# Patient Record
Sex: Male | Born: 1970 | Race: White | Hispanic: No | Marital: Married | Smoking: Current every day smoker
Health system: Southern US, Community
[De-identification: ages and names within clinical notes are randomized; demographics above are authoritative.]

## PROBLEM LIST (undated history)

## (undated) DIAGNOSIS — N289 Disorder of kidney and ureter, unspecified: Secondary | ICD-10-CM

## (undated) DIAGNOSIS — I10 Essential (primary) hypertension: Secondary | ICD-10-CM

## (undated) DIAGNOSIS — G5620 Lesion of ulnar nerve, unspecified upper limb: Secondary | ICD-10-CM

## (undated) DIAGNOSIS — J45909 Unspecified asthma, uncomplicated: Secondary | ICD-10-CM

## (undated) DIAGNOSIS — Z720 Tobacco use: Secondary | ICD-10-CM

## (undated) DIAGNOSIS — M5417 Radiculopathy, lumbosacral region: Secondary | ICD-10-CM

## (undated) DIAGNOSIS — F419 Anxiety disorder, unspecified: Secondary | ICD-10-CM

## (undated) DIAGNOSIS — D696 Thrombocytopenia, unspecified: Secondary | ICD-10-CM

## (undated) DIAGNOSIS — M542 Cervicalgia: Secondary | ICD-10-CM

## (undated) DIAGNOSIS — F1021 Alcohol dependence, in remission: Secondary | ICD-10-CM

## (undated) DIAGNOSIS — F4310 Post-traumatic stress disorder, unspecified: Secondary | ICD-10-CM

## (undated) DIAGNOSIS — H9193 Unspecified hearing loss, bilateral: Secondary | ICD-10-CM

## (undated) HISTORY — DX: Thrombocytopenia, unspecified: D69.6

## (undated) HISTORY — DX: Alcohol dependence, in remission: F10.21

## (undated) HISTORY — PX: CERVICAL SPINE SURGERY: SHX589

## (undated) HISTORY — DX: Lesion of ulnar nerve, unspecified upper limb: G56.20

## (undated) HISTORY — DX: Tobacco use: Z72.0

## (undated) HISTORY — DX: Radiculopathy, lumbosacral region: M54.17

## (undated) HISTORY — DX: Post-traumatic stress disorder, unspecified: F43.10

---

## 2015-06-07 DIAGNOSIS — F4310 Post-traumatic stress disorder, unspecified: Secondary | ICD-10-CM | POA: Insufficient documentation

## 2015-06-07 DIAGNOSIS — F419 Anxiety disorder, unspecified: Secondary | ICD-10-CM | POA: Insufficient documentation

## 2015-06-07 DIAGNOSIS — G5620 Lesion of ulnar nerve, unspecified upper limb: Secondary | ICD-10-CM | POA: Insufficient documentation

## 2015-06-07 DIAGNOSIS — F1021 Alcohol dependence, in remission: Secondary | ICD-10-CM

## 2015-06-07 HISTORY — DX: Alcohol dependence, in remission: F10.21

## 2015-06-07 HISTORY — DX: Post-traumatic stress disorder, unspecified: F43.10

## 2015-06-07 HISTORY — DX: Lesion of ulnar nerve, unspecified upper limb: G56.20

## 2015-06-10 DIAGNOSIS — D696 Thrombocytopenia, unspecified: Secondary | ICD-10-CM | POA: Insufficient documentation

## 2015-06-10 HISTORY — DX: Thrombocytopenia, unspecified: D69.6

## 2015-08-02 ENCOUNTER — Emergency Department (INDEPENDENT_AMBULATORY_CARE_PROVIDER_SITE_OTHER)
Admission: EM | Admit: 2015-08-02 | Discharge: 2015-08-02 | Disposition: A | Discharge status: Home / Self Care | Payer: No Typology Code available for payment source | Source: Home / Self Care | Attending: Family Medicine | Admitting: Family Medicine

## 2015-08-02 ENCOUNTER — Encounter: Payer: Self-pay | Admitting: Emergency Medicine

## 2015-08-02 DIAGNOSIS — R300 Dysuria: Secondary | ICD-10-CM | POA: Diagnosis not present

## 2015-08-02 DIAGNOSIS — Z87442 Personal history of urinary calculi: Secondary | ICD-10-CM | POA: Diagnosis not present

## 2015-08-02 HISTORY — DX: Cervicalgia: M54.2

## 2015-08-02 HISTORY — DX: Unspecified hearing loss, bilateral: H91.93

## 2015-08-02 HISTORY — DX: Essential (primary) hypertension: I10

## 2015-08-02 HISTORY — DX: Disorder of kidney and ureter, unspecified: N28.9

## 2015-08-02 HISTORY — DX: Unspecified asthma, uncomplicated: J45.909

## 2015-08-02 HISTORY — DX: Anxiety disorder, unspecified: F41.9

## 2015-08-02 LAB — POCT URINALYSIS DIP (MANUAL ENTRY)
BILIRUBIN UA: NEGATIVE
Glucose, UA: NEGATIVE
Ketones, POC UA: NEGATIVE
Leukocytes, UA: NEGATIVE
NITRITE UA: NEGATIVE
PH UA: 7 (ref 5–8)
Protein Ur, POC: 30 — AB
SPEC GRAV UA: 1.02 (ref 1.005–1.03)
UROBILINOGEN UA: 0.2 (ref 0–1)

## 2015-08-02 MED ORDER — HYDROCODONE-ACETAMINOPHEN 5-325 MG PO TABS: 1.0000 | ORAL_TABLET | Freq: Four times a day (QID) | ORAL | Status: DC | PRN

## 2015-08-02 MED ORDER — SULFAMETHOXAZOLE-TRIMETHOPRIM 800-160 MG PO TABS: 1.0000 | ORAL_TABLET | Freq: Two times a day (BID) | ORAL | Status: DC

## 2015-08-02 NOTE — ED Notes (Signed)
Reports passing a kidney stone a couple weeks ago; did not follow with urologist; felt fine, then 1 week ago started experiencing pain in right testical and penis areas.

## 2015-08-02 NOTE — ED Provider Notes (Signed)
CSN: [id number]     Arrival date & time 08/02/15  1551 History   None    Chief Complaint  Patient presents with  . Dysuria  . Urinary Frequency  . Hematuria      HPI Comments: Patient believes that he passed a small kidney stone about 4 weeks ago; a CT scan through [facility] showed a small stone. During the past week he has had increased frequency without dysuria, and his urine has been dark.  He has had intermittent vague discomfort radiating to his right scrotum and penis.  Two nights ago he had vague right back ache.  No fevers, chills, and sweats.  No nausea/vomiting   Patient is a 45 y.o. male presenting with frequency. The history is provided by the patient.  Urinary Frequency Episode onset: 1 week ago. The problem occurs constantly. The problem has not changed since onset.Pertinent negatives include no abdominal pain. Nothing aggravates the symptoms. Nothing relieves the symptoms. He has tried nothing for the symptoms.    Past Medical History  Diagnosis Date  . Anxiety   . Hypertension   . Hearing loss associated with syndrome of both ears   . Neck pain   . Asthma   . Renal disorder     renal calculi   Past Surgical History  Procedure Laterality Date  . Cervical spine surgery      5, 6, and 7 fusion   Family History  Problem Relation Age of Onset  . Heart failure Father   . Hypertension Father    Social History  Substance Use Topics  . Smoking status: Current Every Day Smoker  . Smokeless tobacco: None  . Alcohol Use: Yes    Review of Systems  Constitutional: Negative for fever, chills, diaphoresis and fatigue.  HENT: Negative.   Eyes: Negative.   Respiratory: Negative.   Cardiovascular: Negative.   Gastrointestinal: Negative for nausea, abdominal pain and diarrhea.  Genitourinary: Positive for dysuria, frequency, flank pain, penile pain and testicular pain. Negative for urgency, hematuria, decreased urine volume, discharge, penile swelling, scrotal swelling and  genital sores.  Musculoskeletal: Negative.   All other systems reviewed and are negative.   Allergies  Penicillins  Home Medications   Prior to Admission medications   Medication Sig Start Date End Date Taking? Authorizing Provider  albuterol (PROVENTIL HFA;VENTOLIN HFA) 108 (90 Base) MCG/ACT inhaler Inhale into the lungs every 6 (six) hours as needed for wheezing or shortness of breath.   Yes Historical Provider, MD  amLODipine (NORVASC) 5 MG tablet Take 7.5 mg by mouth daily.   Yes Historical Provider, MD  gabapentin (NEURONTIN) 100 MG capsule Take 100 mg by mouth 3 (three) times daily.   Yes Historical Provider, MD  traZODone (DESYREL) 100 MG tablet Take 100 mg by mouth at bedtime as needed for sleep.   Yes Historical Provider, MD  HYDROcodone-acetaminophen (NORCO/VICODIN) 5-325 MG tablet Take 1 tablet by mouth every 6 (six) hours as needed for moderate pain. 08/02/15   Kandra Nicolas, MD  sulfamethoxazole-trimethoprim (BACTRIM DS,SEPTRA DS) 800-160 MG tablet Take 1 tablet by mouth 2 (two) times daily. 08/02/15   Kandra Nicolas, MD   Meds Ordered and Administered this Visit  Medications - No data to display  BP 118/74 mmHg  Pulse 78  Temp(Src) 97.9 F (36.6 C) (Oral)  Resp 16  Ht 6' (1.829 m)  Wt 191 lb (86.637 kg)  BMI 25.90 kg/m2  SpO2 98% No data found.   Physical Exam Nursing notes  and Vital Signs reviewed. Appearance:  Patient appears stated age, and in no acute distress Eyes:  Pupils are equal, round, and reactive to light and accomodation.  Extraocular movement is intact.  Conjunctivae are not inflamed  Ears:  Canals normal.  Tympanic membranes normal.  Nose:  Normal turbinates.  No sinus tenderness.   Pharynx:  Normal; moist mucous membranes  Neck:  Supple.  No adenopathy Lungs:  Clear to auscultation.  Breath sounds are equal.  Moving air well. Heart:  Regular rate and rhythm without murmurs, rubs, or gallops.  Abdomen:  Nontender without masses or  hepatosplenomegaly.  Bowel sounds are present.  No CVA or flank tenderness.  Extremities:  No edema.  Skin:  No rash present.  GU:  Normal penis/testes/scrotum  ED Course  Procedures none    Labs Reviewed  POCT URINALYSIS DIP (MANUAL ENTRY) - Abnormal; Notable for the following:    Blood, UA small (*)    Protein Ur, POC =30 (*)    All other components within normal limits  URINE CULTURE  GC/CHLAMYDIA PROBE AMP, URINE     MDM   1. History of kidney stones   2. Dysuria    Urine culture pending.  Rx for Bactrim DS Increase fluid intake.  Resume Flomax.  Continue Naproxen. If symptoms become significantly worse during the night or over the weekend, proceed to the local emergency room.  Dispensed urine strainer. Recommend follow-up with urologist for stone analysis.    Kandra Nicolas, MD 08/14/15 [phone]

## 2015-08-02 NOTE — Discharge Instructions (Signed)
Increase fluid intake.  Resume Flomax.  Continue Naproxen. If symptoms become significantly worse during the night or over the weekend, proceed to the local emergency room.

## 2015-08-04 LAB — URINE CULTURE
Colony Count: NO GROWTH
Organism ID, Bacteria: NO GROWTH

## 2015-08-05 ENCOUNTER — Telehealth: Payer: Self-pay | Admitting: Emergency Medicine

## 2015-12-01 ENCOUNTER — Emergency Department (INDEPENDENT_AMBULATORY_CARE_PROVIDER_SITE_OTHER): Payer: Commercial Managed Care - PPO

## 2015-12-01 ENCOUNTER — Emergency Department (INDEPENDENT_AMBULATORY_CARE_PROVIDER_SITE_OTHER)
Admission: EM | Admit: 2015-12-01 | Discharge: 2015-12-01 | Disposition: A | Discharge status: Home / Self Care | Payer: Commercial Managed Care - PPO | Source: Home / Self Care | Attending: Family Medicine | Admitting: Family Medicine

## 2015-12-01 ENCOUNTER — Encounter: Payer: Self-pay | Admitting: Emergency Medicine

## 2015-12-01 DIAGNOSIS — S2232XA Fracture of one rib, left side, initial encounter for closed fracture: Secondary | ICD-10-CM | POA: Diagnosis not present

## 2015-12-01 DIAGNOSIS — R0781 Pleurodynia: Secondary | ICD-10-CM | POA: Diagnosis not present

## 2015-12-01 MED ORDER — HYDROCODONE-ACETAMINOPHEN 5-325 MG PO TABS: ORAL_TABLET | ORAL | Status: DC

## 2015-12-01 NOTE — ED Notes (Signed)
Pt c/o possible cracked left sided rib, hit the side of a door lock x 2 days ago.  It hurts pt to take a deep breath or cough.

## 2015-12-01 NOTE — Discharge Instructions (Signed)
May wear Ace wrap on chest for limited amounts of time daytime (do not wear constantly).  May take Ibuprofen 200mg , 4 tabs every 8 hours with food.  Apply ice pack for 20 to 30 minutes, 3 to 4 times daily  Continue until pain decreases.  If symptoms become significantly worse during the night or over the weekend, proceed to the local emergency room.    Rib Fracture A rib fracture is a break or crack in one of the bones of the ribs. The ribs are a group of long, curved bones that wrap around your chest and attach to your spine. They protect your lungs and other organs in the chest cavity. A broken or cracked rib is often painful, but most do not cause other problems. Most rib fractures heal on their own over time. However, rib fractures can be more serious if multiple ribs are broken or if broken ribs move out of place and push against other structures. CAUSES   A direct blow to the chest. For example, this could happen during contact sports, a car accident, or a fall against a hard object.  Repetitive movements with high force, such as pitching a baseball or having severe coughing spells. SYMPTOMS   Pain when you breathe in or cough.  Pain when someone presses on the injured area. DIAGNOSIS  Your caregiver will perform a physical exam. Various imaging tests may be ordered to confirm the diagnosis and to look for related injuries. These tests may include a chest X-ray, computed tomography (CT), magnetic resonance imaging (MRI), or a bone scan. TREATMENT  Rib fractures usually heal on their own in 1-3 months. The longer healing period is often associated with a continued cough or other aggravating activities. During the healing period, pain control is very important. Medication is usually given to control pain. Hospitalization or surgery may be needed for more severe injuries, such as those in which multiple ribs are broken or the ribs have moved out of place.  HOME CARE INSTRUCTIONS   Avoid  strenuous activity and any activities or movements that cause pain. Be careful during activities and avoid bumping the injured rib.  Gradually increase activity as directed by your caregiver.  Only take over-the-counter or prescription medications as directed by your caregiver. Do not take other medications without asking your caregiver first.  Apply ice to the injured area for the first 1-2 days after you have been treated or as directed by your caregiver. Applying ice helps to reduce inflammation and pain.  Put ice in a plastic bag.  Place a towel between your skin and the bag.   Leave the ice on for 15-20 minutes at a time, every 2 hours while you are awake.  Perform deep breathing as directed by your caregiver. This will help prevent pneumonia, which is a common complication of a broken rib. Your caregiver may instruct you to:  Take deep breaths several times a day.  Try to cough several times a day, holding a pillow against the injured area.  Use a device called an incentive spirometer to practice deep breathing several times a day.  Drink enough fluids to keep your urine clear or pale yellow. This will help you avoid constipation.   SEEK IMMEDIATE MEDICAL CARE IF:   You have a fever.   You have difficulty breathing or shortness of breath.   You develop a continual cough, or you cough up thick or bloody sputum.  You feel sick to your stomach (nausea), throw  up (vomit), or have abdominal pain.   You have worsening pain not controlled with medications.  MAKE SURE YOU:  Understand these instructions.  Will watch your condition.  Will get help right away if you are not doing well or get worse.   This information is not intended to replace advice given to you by your health care provider. Make sure you discuss any questions you have with your health care provider.   Document Released: 05/12/2005 Document Revised: 01/12/2013 Document Reviewed: 07/14/2012 Elsevier  Interactive Patient Education [employer].

## 2015-12-01 NOTE — ED Provider Notes (Signed)
CSN: [id number]     Arrival date & time 12/01/15  1539 History   First MD Initiated Contact with Patient 12/01/15 1630     Chief Complaint  Patient presents with  . Rib Injury      HPI Comments: Patient states that he hit the left side of his chest on a door hasp two days ago.  He has had persistent left chest pain with inspiration and movement.  No shortness of breath.  No fevers, chills, and sweats.  He states that he fractured some ribs on his left side in the past.  Patient is a 45 y.o. male presenting with chest pain. The history is provided by the patient.  Chest Pain Pain location:  L lateral chest Pain quality: sharp   Pain quality: not radiating   Pain radiates to:  Does not radiate Pain severity:  Moderate Onset quality:  Sudden Duration:  2 days Timing:  Constant Progression:  Unchanged Chronicity:  New Context: trauma   Relieved by:  Nothing Worsened by:  Coughing, movement and deep breathing Ineffective treatments:  None tried Associated symptoms: no abdominal pain, no back pain, no cough, no diaphoresis, no dizziness, no fatigue, no fever, no nausea, no palpitations, no shortness of breath and no syncope     Past Medical History  Diagnosis Date  . Anxiety   . Hypertension   . Hearing loss associated with syndrome of both ears   . Neck pain   . Asthma   . Renal disorder     renal calculi   Past Surgical History  Procedure Laterality Date  . Cervical spine surgery      5, 6, and 7 fusion   Family History  Problem Relation Age of Onset  . Heart failure Father   . Hypertension Father    Social History  Substance Use Topics  . Smoking status: Current Every Day Smoker  . Smokeless tobacco: None  . Alcohol Use: Yes     Comment: 7-9 drinks    Review of Systems  Constitutional: Negative for fever, diaphoresis and fatigue.  Respiratory: Negative for cough and shortness of breath.   Cardiovascular: Positive for chest pain. Negative for palpitations and  syncope.  Gastrointestinal: Negative for nausea and abdominal pain.  Musculoskeletal: Negative for back pain.  Neurological: Negative for dizziness.  All other systems reviewed and are negative.   Allergies  Penicillins  Home Medications   Prior to Admission medications   Medication Sig Start Date End Date Taking? Authorizing Provider  albuterol (PROVENTIL HFA;VENTOLIN HFA) 108 (90 Base) MCG/ACT inhaler Inhale into the lungs every 6 (six) hours as needed for wheezing or shortness of breath.    Historical Provider, MD  amLODipine (NORVASC) 5 MG tablet Take 7.5 mg by mouth daily.    Historical Provider, MD  HYDROcodone-acetaminophen (NORCO/VICODIN) 5-325 MG tablet Take one by mouth at bedtime as needed for pain 12/01/15   Kandra Nicolas, MD   Meds Ordered and Administered this Visit  Medications - No data to display  BP 133/72 mmHg  Pulse 69  Temp(Src) 98 F (36.7 C) (Oral)  Ht 6' (1.829 m)  Wt 198 lb 8 oz (90.039 kg)  BMI 26.92 kg/m2  SpO2 96% No data found.   Physical Exam  Constitutional: He is oriented to person, place, and time. He appears well-developed and well-nourished. No distress.  HENT:  Head: Atraumatic.  Nose: Nose normal.  Mouth/Throat: Oropharynx is clear and moist.  Eyes: Conjunctivae are normal. Pupils are  equal, round, and reactive to light.  Neck: Neck supple.  Cardiovascular: Normal heart sounds.   Pulmonary/Chest: Breath sounds normal. No respiratory distress. He has no wheezes. He has no rales.   He exhibits tenderness.  Tenderness to palpation over left posterior/lateral ribs as noted on diagram.  No ecchymosis or swelling noted.    Abdominal: There is no tenderness.  Musculoskeletal: He exhibits no edema or tenderness.  Lymphadenopathy:    He has no cervical adenopathy.  Neurological: He is alert and oriented to person, place, and time.  Skin: Skin is warm and dry.  Nursing note and vitals reviewed.   ED Course  Procedures  none   Imaging Review Dg Ribs Unilateral W/chest Left  12/01/2015  CLINICAL DATA:  Left rib injury/ pain 2 days ago, prior fracture new EXAM: LEFT RIBS AND CHEST - 3+ VIEW COMPARISON:  None. FINDINGS: Suspected mildly displaced left posterior 9th and 10th rib fractures and nondisplaced left posterior 8th rib fracture. Suspected old/healed left lateral 6th through 8th rib fracture deformities. Visualized lungs are clear.  No pleural effusion or pneumothorax. The heart is normal in size. Cervical spine fixation hardware. IMPRESSION: Suspected left posterior 8th through 10th rib fractures, as above. Old/healed left lateral 6th through 8th rib fracture deformities. No pneumothorax. Electronically Signed   By: Julian Hy M.D.   On: 12/01/2015 17:00      MDM   1. Rib fractures, left, closed, initial encounter    Concern for recurrent rib fractures with relatively minor trauma. Rx for Lortab for pain at bedtime. May wear Ace wrap on chest for limited amounts of time daytime (do not wear constantly).  May take Ibuprofen 200mg , 4 tabs every 8 hours with food.  Apply ice pack for 20 to 30 minutes, 3 to 4 times daily  Continue until pain decreases.  If symptoms become significantly worse during the night or over the weekend, proceed to the local emergency room.  Followup with PCP to check Vitamin D level     Kandra Nicolas, MD 12/09/15 1459

## 2016-01-22 ENCOUNTER — Emergency Department
Admission: EM | Admit: 2016-01-22 | Discharge: 2016-01-22 | Disposition: A | Discharge status: Home / Self Care | Payer: Commercial Managed Care - PPO | Source: Home / Self Care | Attending: Family Medicine | Admitting: Family Medicine

## 2016-01-22 ENCOUNTER — Encounter: Payer: Self-pay | Admitting: *Deleted

## 2016-01-22 DIAGNOSIS — Z981 Arthrodesis status: Secondary | ICD-10-CM | POA: Diagnosis not present

## 2016-01-22 DIAGNOSIS — J069 Acute upper respiratory infection, unspecified: Secondary | ICD-10-CM

## 2016-01-22 DIAGNOSIS — J4520 Mild intermittent asthma, uncomplicated: Secondary | ICD-10-CM

## 2016-01-22 DIAGNOSIS — D170 Benign lipomatous neoplasm of skin and subcutaneous tissue of head, face and neck: Secondary | ICD-10-CM | POA: Diagnosis not present

## 2016-01-22 DIAGNOSIS — B9789 Other viral agents as the cause of diseases classified elsewhere: Principal | ICD-10-CM

## 2016-01-22 MED ORDER — KETOROLAC TROMETHAMINE 60 MG/2ML IM SOLN
60.0000 mg | Freq: Once | INTRAMUSCULAR | Status: AC
  Administered 2016-01-22: 60 mg via INTRAMUSCULAR

## 2016-01-22 MED ORDER — DOXYCYCLINE HYCLATE 100 MG PO CAPS: 100.0000 mg | ORAL_CAPSULE | Freq: Two times a day (BID) | ORAL | 0 refills | Status: DC

## 2016-01-22 NOTE — ED Provider Notes (Signed)
[name]    CSN: [id number] Arrival date & time: 01/22/16  [id number]  First Provider Contact:  First MD Initiated Contact with Patient 01/22/16 [phone]        History   Chief Complaint Chief Complaint  Patient presents with  . Back Pain  . Nasal Congestion    HPI Gregory Hart is a 45 y.o. male.   Patient complains of two day history of typical cold-like symptoms including mild sore throat, sinus congestion, headache, fatigue, and cough.  He has a history of mild asthma and has used his albuterol inhaler several times since yesterday.  His asthma is normally well controlled only with albuterol 2 puffs each morning.  He notes that he does not tolerate oral steroids or steroid inhalers. He has a past history of C5,6, 7 fusion in 2013, and complains of increased neck and left shoulder soreness over the past several days. He admits that he and his wife have been moving, and he has been involved with heavy lifting.  He has also had some vague non-radiating lower back ache during the past week.   He denies bowel or bladder dysfunction, and no saddle numbness.  He states that since his neck surgery, he has had a non-tender lump in his left posterior neck that has persisted, and wonders if it could be contributing to his neck soreness.  The lump itself is not tender to palpation.   The history is provided by the patient.    Past Medical History:  Diagnosis Date  . Anxiety   . Asthma   . Hearing loss associated with syndrome of both ears   . Hypertension   . Neck pain   . Renal disorder    renal calculi    There are no active problems to display for this patient.   Past Surgical History:  Procedure Laterality Date  . CERVICAL SPINE SURGERY     5, 6, and 7 fusion       Home Medications    Prior to Admission medications   Medication Sig Start Date End Date Taking? Authorizing Provider  albuterol (PROVENTIL HFA;VENTOLIN HFA) 108 (90 Base) MCG/ACT inhaler Inhale into  the lungs every 6 (six) hours as needed for wheezing or shortness of breath.    Historical Provider, MD  amLODipine (NORVASC) 5 MG tablet Take 7.5 mg by mouth daily.    Historical Provider, MD  doxycycline (VIBRAMYCIN) 100 MG capsule Take 1 capsule (100 mg total) by mouth 2 (two) times daily. Take with food. 01/22/16   Kandra Nicolas, MD    Family History Family History  Problem Relation Age of Onset  . Heart failure Father   . Hypertension Father     Social History Social History  Substance Use Topics  . Smoking status: Current Every Day Smoker  . Smokeless tobacco: Never Used  . Alcohol use Yes     Comment: 7-9 drinks     Allergies   Penicillins   Review of Systems Review of Systems  + sore throat + cough No pleuritic pain No wheezing + nasal congestion + post-nasal drainage No sinus pain/pressure No itchy/red eyes No earache No hemoptysis No SOB No fever, + chills No nausea No vomiting No abdominal pain No diarrhea No urinary symptoms No skin rash + fatigue + myalgias + headache + low back ache No neurologic symptoms (no extremity weakness or paresthesias) Used OTC meds without relief   Physical Exam Triage Vital Signs ED Triage Vitals  Enc  Vitals Group     BP 01/22/16 0855 157/94     Pulse Rate 01/22/16 0855 71     Resp --      Temp 01/22/16 0855 98.2 F (36.8 C)     Temp Source 01/22/16 0855 Oral     SpO2 01/22/16 0855 99 %     Weight 01/22/16 0856 193 lb (87.5 kg)     Height --      Head Circumference --      Peak Flow --      Pain Score 01/22/16 0858 8     Pain Loc --      Pain Edu? --      Excl. in [place]? --    No data found.   Updated Vital Signs BP 157/94 (BP Location: Left Arm)   Pulse 71   Temp 98.2 F (36.8 C) (Oral)   Wt 193 lb (87.5 kg)   SpO2 99%   BMI 26.18 kg/m   Visual Acuity Right Eye Distance:   Left Eye Distance:   Bilateral Distance:    Right Eye Near:   Left Eye Near:    Bilateral Near:     Physical  Exam  Constitutional: He is oriented to person, place, and time. He appears well-developed and well-nourished. No distress.  HENT:  Head: Normocephalic.  Right Ear: Tympanic membrane, external ear and ear canal normal.  Left Ear: Tympanic membrane, external ear and ear canal normal.  Nose: Nose normal.  Eyes: Conjunctivae and EOM are normal. Pupils are equal, round, and reactive to light.  Neck: Normal range of motion. Neck supple.    Tender enlarged posterior/lateral nodes are palpated bilaterally. On the left posterior neck as noted on diagram is a 2cm diameter superficial nontender non-mobile soft tissue mass consistent with a lipoma.    Cardiovascular: Normal heart sounds.   Pulmonary/Chest: Effort normal and breath sounds normal. He has no wheezes. He has no rales. He exhibits no tenderness.  Abdominal: Soft.  Musculoskeletal: Normal range of motion.       Lumbar back: He exhibits no tenderness.  Lymphadenopathy:    He has cervical adenopathy.  Neurological: He is alert and oriented to person, place, and time.  Skin: Skin is warm and dry.  Nursing note and vitals reviewed.    UC Treatments / Results  Labs (all labs ordered are listed, but only abnormal results are displayed) Labs Reviewed - No data to display  EKG  EKG Interpretation None       Radiology No results found.  Procedures Procedures (including critical care time)  Medications Ordered in UC Medications  ketorolac (TORADOL) injection 60 mg (60 mg Intramuscular Given 01/22/16 0851)     Initial Impression / Assessment and Plan / UC Course  I have reviewed the triage vital signs and the nursing notes.  Pertinent labs & imaging results that were available during my care of the patient were reviewed by me and considered in my medical decision making (see chart for details).  Clinical Course   Suspect that patient's increased neck and back pain represent myalgias caused by his present viral  illness. With his significant past history of pneumonia, will begin doxycycline for atypical coverage. Take plain guaifenesin (1200mg  extended release tabs such as Mucinex) twice daily, with plenty of water, for cough and congestion.  May continue Pseudoephedrine (30mg , one or two every 4 to 6 hours) for sinus congestion if blood pressure is not affected.  Get adequate rest. Continue albuterol inhaler  as needed.   May use Afrin nasal spray (or generic oxymetazoline) twice daily for about 5 days and then discontinue.  Also recommend using saline nasal spray several times daily and saline nasal irrigation (AYR is a common brand).  May add Flonase nasal spray each morning after using Afrin nasal spray and saline nasal irrigation. Try warm salt water gargles for sore throat.  Stop all antihistamines for now, and other non-prescription cough/cold preparations. May continue Naproxen 500mg  twice daily with food. May take Delsym Cough Suppressant at bedtime for nighttime cough.  Follow-up with family doctor if not improving about10 days.     Final Clinical Impressions(s) / UC Diagnoses   Final diagnoses:  Viral URI with cough  Asthma, mild intermittent, uncomplicated  History of fusion of cervical spine  Lipoma of neck    New Prescriptions Discharge Medication List as of 01/22/2016  9:24 AM    START taking these medications   Details  doxycycline (VIBRAMYCIN) 100 MG capsule Take 1 capsule (100 mg total) by mouth 2 (two) times daily. Take with food., Starting Tue 01/22/2016, Print         Kandra Nicolas, MD 01/22/16 1141

## 2016-01-22 NOTE — Discharge Instructions (Signed)
Take plain guaifenesin (1200mg  extended release tabs such as Mucinex) twice daily, with plenty of water, for cough and congestion.  May continue Pseudoephedrine (30mg , one or two every 4 to 6 hours) for sinus congestion if blood pressure is not affected.  Get adequate rest. Continue albuterol inhaler as needed.   May use Afrin nasal spray (or generic oxymetazoline) twice daily for about 5 days and then discontinue.  Also recommend using saline nasal spray several times daily and saline nasal irrigation (AYR is a common brand).  May add Flonase nasal spray each morning after using Afrin nasal spray and saline nasal irrigation. Try warm salt water gargles for sore throat.  Stop all antihistamines for now, and other non-prescription cough/cold preparations. May continue Naproxen 500mg  twice daily with food. May take Delsym Cough Suppressant at bedtime for nighttime cough.  Follow-up with family doctor if not improving about10 days.

## 2016-01-22 NOTE — ED Triage Notes (Addendum)
Pt reports he is currently moving. Experiencing low back pain. He had a cervical fusion 3-4 years ago. Experiencing more pain now. He believes he's had a lump on the left side of his neck ever since that was noted to be there before his surgery. He is experiencing left shoulder pain that is a shooting pain. Taken IBF without relief. Also c/o nasal congestion and sinus pressure x yesterday.

## 2016-03-14 ENCOUNTER — Emergency Department
Admission: EM | Admit: 2016-03-14 | Discharge: 2016-03-14 | Disposition: A | Discharge status: Home / Self Care | Payer: Commercial Managed Care - PPO | Source: Home / Self Care | Attending: Family Medicine | Admitting: Family Medicine

## 2016-03-14 ENCOUNTER — Encounter: Payer: Self-pay | Admitting: Emergency Medicine

## 2016-03-14 ENCOUNTER — Emergency Department (INDEPENDENT_AMBULATORY_CARE_PROVIDER_SITE_OTHER): Payer: Commercial Managed Care - PPO

## 2016-03-14 DIAGNOSIS — M5136 Other intervertebral disc degeneration, lumbar region: Secondary | ICD-10-CM

## 2016-03-14 DIAGNOSIS — M5417 Radiculopathy, lumbosacral region: Secondary | ICD-10-CM | POA: Diagnosis not present

## 2016-03-14 DIAGNOSIS — I700 Atherosclerosis of aorta: Secondary | ICD-10-CM | POA: Diagnosis not present

## 2016-03-14 DIAGNOSIS — M5416 Radiculopathy, lumbar region: Secondary | ICD-10-CM

## 2016-03-14 MED ORDER — MELOXICAM 15 MG PO TABS: 15.0000 mg | ORAL_TABLET | Freq: Every day | ORAL | 1 refills | Status: DC

## 2016-03-14 MED ORDER — KETOROLAC TROMETHAMINE 60 MG/2ML IJ SOLN
60.0000 mg | Freq: Once | INTRAMUSCULAR | Status: AC
  Administered 2016-03-14: 60 mg via INTRAMUSCULAR

## 2016-03-14 MED ORDER — CYCLOBENZAPRINE HCL 10 MG PO TABS: 10.0000 mg | ORAL_TABLET | Freq: Three times a day (TID) | ORAL | 1 refills | Status: DC

## 2016-03-14 NOTE — ED Triage Notes (Signed)
Pt c/o low back pain that radiates down leg only on right side. This started after heavy lifting last week. Denies relief with motrin/ice.

## 2016-03-14 NOTE — Discharge Instructions (Signed)
Apply ice pack for 20 to 30 minutes, 2 to 3 times daily  Continue until pain decreases. Begin back range of motion and stretching exercises as tolerated.

## 2016-03-14 NOTE — ED Provider Notes (Signed)
Gregory Hart C[facility]    CSN: [id number] Arrival date & time: 03/14/16  1558     History   Chief Complaint Chief Complaint  Patient presents with  . Back Pain    HPI Gregory Hart is a 45 y.o. male.   Patient states that he helped move an entire deck about 6 weeks ago.  He recalls no injury at that time and no discomfort that evening.  The next day, however, he developed right lower back pain that radiated to his right buttock.  If he coughs, he has a brief pain that radiates to his right foot.  When he walks, the pain radiates to his right buttock.   He denies bowel or bladder dysfunction, and no saddle numbness.  He notes that the symptoms are exacerbated by sitting "cross-legged."   The history is provided by the patient.  Back Pain  Location:  Lumbar spine and gluteal region Quality:  Aching Radiates to:  R posterior upper leg, R thigh and R foot Pain severity:  Mild Pain is:  Worse during the day Onset quality:  Gradual Duration:  6 weeks Timing:  Intermittent Progression:  Unchanged Chronicity:  New Context: lifting heavy objects   Relieved by:  Nothing Worsened by:  Coughing and ambulation Ineffective treatments:  NSAIDs, cold packs and heating pad Associated symptoms: paresthesias   Associated symptoms: no abdominal pain, no bladder incontinence, no bowel incontinence, no dysuria, no fever, no leg pain, no numbness, no pelvic pain, no perianal numbness, no tingling, no weakness and no weight loss     Past Medical History:  Diagnosis Date  . Anxiety   . Asthma   . Hearing loss associated with syndrome of both ears   . Hypertension   . Neck pain   . Renal disorder    renal calculi    There are no active problems to display for this patient.   Past Surgical History:  Procedure Laterality Date  . CERVICAL SPINE SURGERY     5, 6, and 7 fusion       Home Medications    Prior to Admission medications   Medication Sig Start Date End Date Taking?  Authorizing Provider  albuterol (PROVENTIL HFA;VENTOLIN HFA) 108 (90 Base) MCG/ACT inhaler Inhale into the lungs every 6 (six) hours as needed for wheezing or shortness of breath.    Historical Provider, MD  amLODipine (NORVASC) 5 MG tablet Take 7.5 mg by mouth daily.    Historical Provider, MD  cyclobenzaprine (FLEXERIL) 10 MG tablet Take 1 tablet (10 mg total) by mouth 3 (three) times daily. 03/14/16   Kandra Nicolas, MD  doxycycline (VIBRAMYCIN) 100 MG capsule Take 1 capsule (100 mg total) by mouth 2 (two) times daily. Take with food. 01/22/16   Kandra Nicolas, MD  meloxicam (MOBIC) 15 MG tablet Take 1 tablet (15 mg total) by mouth daily. Take with food each morning 03/14/16   Kandra Nicolas, MD    Family History Family History  Problem Relation Age of Onset  . Heart failure Father   . Hypertension Father     Social History Social History  Substance Use Topics  . Smoking status: Current Every Day Smoker  . Smokeless tobacco: Never Used  . Alcohol use Yes     Comment: 7-9 drinks     Allergies   Penicillins   Review of Systems Review of Systems  Constitutional: Negative for fever and weight loss.  Gastrointestinal: Negative for abdominal pain and bowel incontinence.  Genitourinary: Negative for bladder incontinence, dysuria and pelvic pain.  Musculoskeletal: Positive for back pain.  Neurological: Positive for paresthesias. Negative for tingling, weakness and numbness.  All other systems reviewed and are negative.    Physical Exam Triage Vital Signs ED Triage Vitals  Enc Vitals Group     BP 03/14/16 1624 150/91     Pulse Rate 03/14/16 1624 70     Resp --      Temp 03/14/16 1624 98.1 F (36.7 C)     Temp Source 03/14/16 1624 Oral     SpO2 03/14/16 1624 98 %     Weight 03/14/16 1624 198 lb (89.8 kg)     Height --      Head Circumference --      Peak Flow --      Pain Score 03/14/16 1625 7     Pain Loc --      Pain Edu? --      Excl. in [place]? --    No data  found.   Updated Vital Signs BP 150/91 (BP Location: Right Arm)   Pulse 70   Temp 98.1 F (36.7 C) (Oral)   Wt 198 lb (89.8 kg)   SpO2 98%   BMI 26.85 kg/m   Visual Acuity Right Eye Distance:   Left Eye Distance:   Bilateral Distance:    Right Eye Near:   Left Eye Near:    Bilateral Near:     Physical Exam  Constitutional: He appears well-developed and well-nourished. No distress.  HENT:  Head: Normocephalic.  Nose: Nose normal.  Mouth/Throat: Oropharynx is clear and moist.  Eyes: Conjunctivae are normal. Pupils are equal, round, and reactive to light.  Neck: Normal range of motion.  Cardiovascular: Normal heart sounds.   Pulmonary/Chest: Breath sounds normal.  Abdominal: Bowel sounds are normal. There is no tenderness.  Musculoskeletal: He exhibits no edema.       Lumbar back: He exhibits no tenderness and no bony tenderness.  Back:  Range of motion relatively well preserved.  Can heel/toe walk and squat without difficulty.     Straight leg raising test is negative.  Sitting knee extension test is negative.  Strength and sensation in the lower extremities is normal.  Patellar and achilles reflexes are decreased.  Right external hip rotation recreates pain.  Neurological: He is alert.  Skin: Skin is warm and dry. No rash noted.  Nursing note and vitals reviewed.    UC Treatments / Results  Labs (all labs ordered are listed, but only abnormal results are displayed) Labs Reviewed - No data to display  EKG  EKG Interpretation None       Radiology Dg Lumbar Spine Complete  Result Date: 03/14/2016 CLINICAL DATA:  Right low back pain for 6 weeks. Intermittent radicular pain in the right lower extremity. No reported injury. EXAM: LUMBAR SPINE - COMPLETE 4+ VIEW COMPARISON:  None. FINDINGS: This report assumes 5 non rib-bearing lumbar vertebrae. Lumbar vertebral body heights are preserved, with no fracture. There is mild multilevel degenerative disc disease in the  lumbar spine, most prominent at L2-3. There is minimal 3 mm retrolisthesis at L2-3. There is minimal 2 mm retrolisthesis at L3-4. No appreciable facet arthropathy. No aggressive appearing focal osseous lesions. Abdominal aortic atherosclerosis. IMPRESSION: 1. No lumbar spine fracture. 2. Mild multilevel degenerative disc disease, most prominent at L2-3. 3. Minimal multilevel spondylolisthesis, probably degenerative. 4. Aortic atherosclerosis. Electronically Signed   By: Ilona Sorrel M.D.   On: 03/14/2016 17:17  Procedures Procedures (including critical care time)  Medications Ordered in UC Medications  ketorolac (TORADOL) injection 60 mg (60 mg Intramuscular Given 03/14/16 1703)     Initial Impression / Assessment and Plan / UC Course  I have reviewed the triage vital signs and the nursing notes.  Pertinent labs & imaging results that were available during my care of the patient were reviewed by me and considered in my medical decision making (see chart for details).  Clinical Course  Suspect piriformis syndrome. Administered Toradol 60mg  IM  Begin Mobic 15mg  daily, and Flexeril 10mg  TID Apply ice pack for 20 to 30 minutes, 2 to 3 times daily  Continue until pain decreases. Begin back range of motion and stretching exercises as tolerated. Followup with Dr. Aundria Mems or Dr. Lynne Leader ([facility]) if not improving about two weeks.      Final Clinical Impressions(s) / UC Diagnoses   Final diagnoses:  Lumbar back pain with radiculopathy affecting right lower extremity    New Prescriptions New Prescriptions   CYCLOBENZAPRINE (FLEXERIL) 10 MG TABLET    Take 1 tablet (10 mg total) by mouth 3 (three) times daily.   MELOXICAM (MOBIC) 15 MG TABLET    Take 1 tablet (15 mg total) by mouth daily. Take with food each morning     Kandra Nicolas, MD 03/20/16 1739

## 2016-08-11 ENCOUNTER — Encounter: Payer: Self-pay | Admitting: *Deleted

## 2016-08-11 ENCOUNTER — Emergency Department
Admission: EM | Admit: 2016-08-11 | Discharge: 2016-08-11 | Disposition: A | Discharge status: Home / Self Care | Payer: Commercial Managed Care - PPO | Source: Home / Self Care | Attending: Family Medicine | Admitting: Family Medicine

## 2016-08-11 DIAGNOSIS — M5441 Lumbago with sciatica, right side: Secondary | ICD-10-CM

## 2016-08-11 DIAGNOSIS — G8929 Other chronic pain: Secondary | ICD-10-CM

## 2016-08-11 DIAGNOSIS — M545 Low back pain, unspecified: Secondary | ICD-10-CM

## 2016-08-11 MED ORDER — METHYLPREDNISOLONE ACETATE 80 MG/ML IJ SUSP
80.0000 mg | Freq: Once | INTRAMUSCULAR | Status: AC
  Administered 2016-08-11: 80 mg via INTRAMUSCULAR

## 2016-08-11 MED ORDER — KETOROLAC TROMETHAMINE 60 MG/2ML IM SOLN
60.0000 mg | Freq: Once | INTRAMUSCULAR | Status: AC
  Administered 2016-08-11: 60 mg via INTRAMUSCULAR

## 2016-08-11 MED ORDER — HYDROCODONE-ACETAMINOPHEN 5-325 MG PO TABS: 1.0000 | ORAL_TABLET | Freq: Four times a day (QID) | ORAL | 0 refills | Status: DC | PRN

## 2016-08-11 MED ORDER — MELOXICAM 15 MG PO TABS: 15.0000 mg | ORAL_TABLET | Freq: Every day | ORAL | 0 refills | Status: DC

## 2016-08-11 MED ORDER — CYCLOBENZAPRINE HCL 10 MG PO TABS: 10.0000 mg | ORAL_TABLET | Freq: Two times a day (BID) | ORAL | 0 refills | Status: DC | PRN

## 2016-08-11 NOTE — ED Triage Notes (Signed)
Pt c/o RT hip and leg pain, and LT lower back pain x 2 days post fall at home. He took Naproxen at 0500 today.

## 2016-08-11 NOTE — ED Provider Notes (Signed)
CSN: [id number]     Arrival date & time 08/11/16  1618 History   First MD Initiated Contact with Patient 08/11/16 1630     Chief Complaint  Patient presents with  . Hip Pain  . Back Pain   (Consider location/radiation/quality/duration/timing/severity/associated sxs/prior Treatment) HPI  Gregory Hart is a 46 y.o. male presenting to UC with c/o exacerbation of chronic Right hip pain as well as acute onset Left lower back pain after falling off a deck onto some wood stairs 2 days ago.  Pain is aching and sore, worse with certain movements.  He notes he has some bruising to his Left lower back.  He did take Naproxen at [facility] today with moderate relief.  Pain is 7/10 at worst.  Denies hitting his head or other injuries from the fall. He recently got insurance and is currently looking for a PCP and is open to f/u with Sports Medicine or Orthopedists.    Past Medical History:  Diagnosis Date  . Anxiety   . Asthma   . Hearing loss associated with syndrome of both ears   . Hypertension   . Neck pain   . Renal disorder    renal calculi   Past Surgical History:  Procedure Laterality Date  . CERVICAL SPINE SURGERY     5, 6, and 7 fusion   Family History  Problem Relation Age of Onset  . Heart failure Father   . Hypertension Father    Social History  Substance Use Topics  . Smoking status: Current Every Day Smoker  . Smokeless tobacco: Never Used  . Alcohol use Yes     Comment: 7-9 drinks    Review of Systems  Musculoskeletal: Positive for arthralgias, back pain and myalgias. Negative for neck pain and neck stiffness.  Skin: Positive for color change and wound.  Neurological: Negative for weakness and numbness.    Allergies  Penicillins  Home Medications   Prior to Admission medications   Medication Sig Start Date End Date Taking? Authorizing Provider  albuterol (PROVENTIL HFA;VENTOLIN HFA) 108 (90 Base) MCG/ACT inhaler Inhale into the lungs every 6 (six) hours as needed for  wheezing or shortness of breath.    Historical Provider, MD  amLODipine (NORVASC) 5 MG tablet Take 7.5 mg by mouth daily.    Historical Provider, MD  cyclobenzaprine (FLEXERIL) 10 MG tablet Take 1 tablet (10 mg total) by mouth 2 (two) times daily as needed. 08/11/16   Noland Fordyce, PA-C  HYDROcodone-acetaminophen (NORCO/VICODIN) 5-325 MG tablet Take 1-2 tablets by mouth every 6 (six) hours as needed for moderate pain or severe pain. 08/11/16   Noland Fordyce, PA-C  meloxicam (MOBIC) 15 MG tablet Take 1 tablet (15 mg total) by mouth daily. For 7 days, then daily as needed for pain 08/11/16   Noland Fordyce, PA-C   Meds Ordered and Administered this Visit   Medications  methylPREDNISolone acetate (DEPO-MEDROL) injection 80 mg (not administered)  ketorolac (TORADOL) injection 60 mg (not administered)    BP (!) 153/97 (BP Location: Left Arm)   Pulse 70   Temp 98.4 F (36.9 C) (Oral)   Resp 16   Ht 6' (1.829 m)   Wt 196 lb (88.9 kg)   SpO2 100%   BMI 26.58 kg/m  No data found.   Physical Exam  Constitutional: He is oriented to person, place, and time. He appears well-developed and well-nourished. No distress.  HENT:  Head: Normocephalic and atraumatic.  Eyes: EOM are normal.  Neck: Normal range  of motion.  Cardiovascular: Normal rate.   Pulmonary/Chest: Effort normal.  Musculoskeletal: Normal range of motion. He exhibits tenderness. He exhibits no edema.  Right hip: tender. Full ROM w/o crepitus. Negative straight leg raise.  Back: no midline spinal tenderness. Tenderness to Left lower back and buttock. Bruising to area. No bony tenderness. Negative straight leg raise.  Slow to get from sitting to lying and from lying to sitting position. Antalgic gait at times.   Neurological: He is alert and oriented to person, place, and time.  Reflex Scores:      Patellar reflexes are 2+ on the right side and 2+ on the left side. Skin: Skin is warm and dry. He is not diaphoretic.  Psychiatric: He  has a normal mood and affect. His behavior is normal.  Nursing note and vitals reviewed.   Urgent Care Course     Procedures (including critical care time)  Labs Review Labs Reviewed - No data to display  Imaging Review No results found.    MDM   1. Acute left-sided low back pain without sciatica   2. Chronic right-sided low back pain with right-sided sciatica    Hx and exam c/w muscle strain with ecchymosis to Left side of back and Right side sciatica.  Tx in UC: Depo-medrol 80mg  IM and Toradol 60mg  IM  Rx: Flexeril, Mobic, Norco  f/u with Sports Medicine/Primary Care in 1-2 weeks.        Noland Fordyce, PA-C 08/11/16 [phone]

## 2016-08-11 NOTE — Discharge Instructions (Signed)
°  Meloxicam (Mobic) is an antiinflammatory to help with pain and inflammation.  Do not take ibuprofen, Advil, Aleve, or any other medications that contain NSAIDs while taking meloxicam as this may cause stomach upset or even ulcers if taken in large amounts for an extended period of time.   Flexeril is a muscle relaxer and may cause drowsiness. Do not drink alcohol, drive, or operate heavy machinery while taking.  Norco/Vicodin (hydrocodone-acetaminophen) is a narcotic pain medication, do not combine these medications with others containing tylenol. While taking, do not drink alcohol, drive, or perform any other activities that requires focus while taking these medications.

## 2016-10-10 ENCOUNTER — Encounter: Payer: Self-pay | Admitting: Sports Medicine

## 2016-10-10 ENCOUNTER — Ambulatory Visit (INDEPENDENT_AMBULATORY_CARE_PROVIDER_SITE_OTHER): Payer: Commercial Managed Care - PPO | Admitting: Sports Medicine

## 2016-10-10 DIAGNOSIS — M5417 Radiculopathy, lumbosacral region: Secondary | ICD-10-CM | POA: Diagnosis not present

## 2016-10-10 HISTORY — DX: Radiculopathy, lumbosacral region: M54.17

## 2016-10-10 MED ORDER — MELOXICAM 15 MG PO TABS: ORAL_TABLET | ORAL | 3 refills | Status: DC

## 2016-10-10 MED ORDER — PREDNISONE 50 MG PO TABS: ORAL_TABLET | ORAL | 0 refills | Status: DC

## 2016-10-10 MED ORDER — CYCLOBENZAPRINE HCL 10 MG PO TABS: ORAL_TABLET | ORAL | 0 refills | Status: DC

## 2016-10-10 NOTE — Progress Notes (Signed)
   Subjective:    I'm seeing this patient as a consultation for:  Dr. Theone Murdoch  CC: Back pain  HPI: This is a pleasant 46 year old male, for 9 months now he's had severe pain in his back with radiation down the right leg in an L4 distribution after stepping in a hole while building a deck. No bowel or moderate dysfunction, saddle numbness, constitutional symptoms.  He has had a few steroid injections in urgent care with good results. Pain is moderate, persistent.  Past medical history:  Negative.  See flowsheet/record as well for more information.  Surgical history: Negative.  See flowsheet/record as well for more information.  Family history: Negative.  See flowsheet/record as well for more information.  Social history: Negative.  See flowsheet/record as well for more information.  Allergies, and medications have been entered into the medical record, reviewed, and no changes needed.   Review of Systems: No headache, visual changes, nausea, vomiting, diarrhea, constipation, dizziness, abdominal pain, skin rash, fevers, chills, night sweats, weight loss, swollen lymph nodes, body aches, joint swelling, muscle aches, chest pain, shortness of breath, mood changes, visual or auditory hallucinations.   Objective:   General: Well Developed, well nourished, and in no acute distress.  Neuro/Psych: Alert and oriented x3, extra-ocular muscles intact, able to move all 4 extremities, sensation grossly intact. Skin: Warm and dry, no rashes noted.  Respiratory: Not using accessory muscles, speaking in full sentences, trachea midline.  Cardiovascular: Pulses palpable, no extremity edema. Abdomen: Does not appear distended. Back Exam:  Inspection: Unremarkable  Motion: Flexion 45 deg, Extension 45 deg, Side Bending to 45 deg bilaterally,  Rotation to 45 deg bilaterally  SLR laying: Negative  XSLR laying: Negative  Palpable tenderness: None. FABER: negative. Sensory change: Gross sensation  intact to all lumbar and sacral dermatomes.  Reflexes: 2+ at both patellar tendons, 2+ at achilles tendons, Babinski's downgoing.  Strength at foot  Plantar-flexion: 5/5 Dorsi-flexion: 5/5 Eversion: 5/5 Inversion: 5/5  Leg strength  Quad: 5/5 Hamstring: 5/5 Hip flexor: 5/5 Hip abductors: 5/5  Gait unremarkable.  Impression and Recommendations:   This case required medical decision making of moderate complexity.  Lumbosacral radiculopathy at L4 Right L4 radiculopathy. Prednisone, meloxicam, Flexeril, formal physical therapy. Return to see me in one month, MRI for interventional planning if no better.

## 2016-10-10 NOTE — Assessment & Plan Note (Signed)
Right L4 radiculopathy. Prednisone, meloxicam, Flexeril, formal physical therapy. Return to see me in one month, MRI for interventional planning if no better.

## 2016-10-13 MED ORDER — KETOROLAC TROMETHAMINE 30 MG/ML IJ SOLN
30.0000 mg | Freq: Once | INTRAMUSCULAR | Status: AC
Start: 2016-10-10 — End: 2016-10-10
  Administered 2016-10-10: 30 mg via INTRAMUSCULAR

## 2016-10-13 NOTE — Addendum Note (Signed)
Addended by: Elizabeth Sauer on: 10/13/2016 09:03 AM   Modules accepted: Orders

## 2016-10-17 ENCOUNTER — Other Ambulatory Visit: Payer: Self-pay | Admitting: Sports Medicine

## 2016-10-17 DIAGNOSIS — M5417 Radiculopathy, lumbosacral region: Secondary | ICD-10-CM

## 2016-12-09 ENCOUNTER — Ambulatory Visit (INDEPENDENT_AMBULATORY_CARE_PROVIDER_SITE_OTHER): Payer: Commercial Managed Care - PPO | Admitting: Physician Assistant

## 2016-12-09 ENCOUNTER — Encounter: Payer: Self-pay | Admitting: Physician Assistant

## 2016-12-09 VITALS — BP 163/104 | HR 109 | Wt 184.0 lb

## 2016-12-09 DIAGNOSIS — I10 Essential (primary) hypertension: Secondary | ICD-10-CM | POA: Insufficient documentation

## 2016-12-09 DIAGNOSIS — E876 Hypokalemia: Secondary | ICD-10-CM

## 2016-12-09 DIAGNOSIS — F1021 Alcohol dependence, in remission: Secondary | ICD-10-CM | POA: Diagnosis not present

## 2016-12-09 DIAGNOSIS — N529 Male erectile dysfunction, unspecified: Secondary | ICD-10-CM | POA: Diagnosis not present

## 2016-12-09 DIAGNOSIS — Z13220 Encounter for screening for lipoid disorders: Secondary | ICD-10-CM

## 2016-12-09 DIAGNOSIS — Z7689 Persons encountering health services in other specified circumstances: Secondary | ICD-10-CM | POA: Diagnosis not present

## 2016-12-09 DIAGNOSIS — Z720 Tobacco use: Secondary | ICD-10-CM

## 2016-12-09 HISTORY — DX: Tobacco use: Z72.0

## 2016-12-09 MED ORDER — SILDENAFIL CITRATE 50 MG PO TABS: ORAL_TABLET | ORAL | 0 refills | Status: AC

## 2016-12-09 NOTE — Patient Instructions (Addendum)
I have also ordered fasting labs. The lab is a walk-in open M-F 7:30a-4:30p (closed 12:30-1:30p). Nothing to eat or drink after midnight or at least 8 hours before your blood draw. You can have water and your medications.   For your blood pressure: - Continue your Amlodipine 10mg  daily - Check blood pressure at home for the next 2 weeks - Check around the same time each day in a relaxed setting - Limit salt. Follow DASH eating plan - Follow-up in 2 weeks   DASH Eating Plan DASH stands for "Dietary Approaches to Stop Hypertension." The DASH eating plan is a healthy eating plan that has been shown to reduce high blood pressure (hypertension). It may also reduce your risk for type 2 diabetes, heart disease, and stroke. The DASH eating plan may also help with weight loss. What are tips for following this plan? General guidelines  Avoid eating more than 2,300 mg (milligrams) of salt (sodium) a day. If you have hypertension, you may need to reduce your sodium intake to 1,500 mg a day.  Limit alcohol intake to no more than 1 drink a day for nonpregnant women and 2 drinks a day for men. One drink equals 12 oz of beer, 5 oz of wine, or 1 oz of hard liquor.  Work with your health care provider to maintain a healthy body weight or to lose weight. Ask what an ideal weight is for you.  Get at least 30 minutes of exercise that causes your heart to beat faster (aerobic exercise) most days of the week. Activities may include walking, swimming, or biking.  Work with your health care provider or diet and nutrition specialist (dietitian) to adjust your eating plan to your individual calorie needs. Reading food labels  Check food labels for the amount of sodium per serving. Choose foods with less than 5 percent of the Daily Value of sodium. Generally, foods with less than 300 mg of sodium per serving fit into this eating plan.  To find whole grains, look for the word "whole" as the first word in the  ingredient list. Shopping  Buy products labeled as "low-sodium" or "no salt added."  Buy fresh foods. Avoid canned foods and premade or frozen meals. Cooking  Avoid adding salt when cooking. Use salt-free seasonings or herbs instead of table salt or sea salt. Check with your health care provider or pharmacist before using salt substitutes.  Do not fry foods. Cook foods using healthy methods such as baking, boiling, grilling, and broiling instead.  Cook with heart-healthy oils, such as olive, canola, soybean, or sunflower oil. Meal planning   Eat a balanced diet that includes: ? 5 or more servings of fruits and vegetables each day. At each meal, try to fill half of your plate with fruits and vegetables. ? Up to 6-8 servings of whole grains each day. ? Less than 6 oz of lean meat, poultry, or fish each day. A 3-oz serving of meat is about the same size as a deck of cards. One egg equals 1 oz. ? 2 servings of low-fat dairy each day. ? A serving of nuts, seeds, or beans 5 times each week. ? Heart-healthy fats. Healthy fats called Omega-3 fatty acids are found in foods such as flaxseeds and coldwater fish, like sardines, salmon, and mackerel.  Limit how much you eat of the following: ? Canned or prepackaged foods. ? Food that is high in trans fat, such as fried foods. ? Food that is high in saturated fat,  such as fatty meat. ? Sweets, desserts, sugary drinks, and other foods with added sugar. ? Full-fat dairy products.  Do not salt foods before eating.  Try to eat at least 2 vegetarian meals each week.  Eat more home-cooked food and less restaurant, buffet, and fast food.  When eating at a restaurant, ask that your food be prepared with less salt or no salt, if possible. What foods are recommended? The items listed may not be a complete list. Talk with your dietitian about what dietary choices are best for you. Grains Whole-grain or whole-wheat bread. Whole-grain or whole-wheat  pasta. Brown rice. [place]. Bulgur. Whole-grain and low-sodium cereals. Pita bread. Low-fat, low-sodium crackers. Whole-wheat flour tortillas. Vegetables Fresh or frozen vegetables (raw, steamed, roasted, or grilled). Low-sodium or reduced-sodium tomato and vegetable juice. Low-sodium or reduced-sodium tomato sauce and tomato paste. Low-sodium or reduced-sodium canned vegetables. Fruits All fresh, dried, or frozen fruit. Canned fruit in natural juice (without added sugar). Meat and other protein foods Skinless chicken or [place]. Ground chicken or [place]. Pork with fat trimmed off. Fish and seafood. Egg whites. Dried beans, peas, or lentils. Unsalted nuts, nut butters, and seeds. Unsalted canned beans. Lean cuts of beef with fat trimmed off. Low-sodium, lean deli meat. Dairy Low-fat (1%) or fat-free (skim) milk. Fat-free, low-fat, or reduced-fat cheeses. Nonfat, low-sodium ricotta or cottage cheese. Low-fat or nonfat yogurt. Low-fat, low-sodium cheese. Fats and oils Soft margarine without trans fats. Vegetable oil. Low-fat, reduced-fat, or light mayonnaise and salad dressings (reduced-sodium). Canola, safflower, olive, soybean, and sunflower oils. Avocado. Seasoning and other foods Herbs. Spices. Seasoning mixes without salt. Unsalted popcorn and pretzels. Fat-free sweets. What foods are not recommended? The items listed may not be a complete list. Talk with your dietitian about what dietary choices are best for you. Grains Baked goods made with fat, such as croissants, muffins, or some breads. Dry pasta or rice meal packs. Vegetables Creamed or fried vegetables. Vegetables in a cheese sauce. Regular canned vegetables (not low-sodium or reduced-sodium). Regular canned tomato sauce and paste (not low-sodium or reduced-sodium). Regular tomato and vegetable juice (not low-sodium or reduced-sodium). Angie Fava. Olives. Fruits Canned fruit in a light or heavy syrup. Fried fruit. Fruit in cream or  butter sauce. Meat and other protein foods Fatty cuts of meat. Ribs. Fried meat. Berniece Salines. Sausage. Bologna and other processed lunch meats. Salami. Fatback. Hotdogs. Bratwurst. Salted nuts and seeds. Canned beans with added salt. Canned or smoked fish. Whole eggs or egg yolks. Chicken or [place] with skin. Dairy Whole or 2% milk, cream, and half-and-half. Whole or full-fat cream cheese. Whole-fat or sweetened yogurt. Full-fat cheese. Nondairy creamers. Whipped toppings. Processed cheese and cheese spreads. Fats and oils Butter. Stick margarine. Lard. Shortening. Ghee. Bacon fat. Tropical oils, such as coconut, palm kernel, or palm oil. Seasoning and other foods Salted popcorn and pretzels. Onion salt, garlic salt, seasoned salt, table salt, and sea salt. Worcestershire sauce. Tartar sauce. Barbecue sauce. Teriyaki sauce. Soy sauce, including reduced-sodium. Steak sauce. Canned and packaged gravies. Fish sauce. Oyster sauce. Cocktail sauce. Horseradish that you find on the shelf. Ketchup. Mustard. Meat flavorings and tenderizers. Bouillon cubes. Hot sauce and Tabasco sauce. Premade or packaged marinades. Premade or packaged taco seasonings. Relishes. Regular salad dressings. Where to find more information:  National Heart, Lung, and Benjamin Perez: https://wilson-eaton.com/  American Heart Association: www.heart.org Summary  The DASH eating plan is a healthy eating plan that has been shown to reduce high blood pressure (hypertension). It may also reduce your  risk for type 2 diabetes, heart disease, and stroke.  With the DASH eating plan, you should limit salt (sodium) intake to 2,300 mg a day. If you have hypertension, you may need to reduce your sodium intake to 1,500 mg a day.  When on the DASH eating plan, aim to eat more fresh fruits and vegetables, whole grains, lean proteins, low-fat dairy, and heart-healthy fats.  Work with your health care provider or diet and nutrition specialist (dietitian) to  adjust your eating plan to your individual calorie needs. This information is not intended to replace advice given to you by your health care provider. Make sure you discuss any questions you have with your health care provider. Document Released: 05/01/2011 Document Revised: 05/05/2016 Document Reviewed: 05/05/2016 Elsevier Interactive Patient Education  2017 [employer].

## 2016-12-09 NOTE — Progress Notes (Signed)
HPI:                                                                Gregory Hart is a 46 y.o. male who presents to [facility]: [place] today to establish care   Current Concerns include erectile dysfunction  HTN: taking Amlodipine 10mg  daily. Compliant with medications. Does not checks BP's at home. Denies vision change, headache, chest pain with exertion, orthopnea, lightheadedness, syncope. Endorses occasional peripheral edema after working on his feet all day. Risk factors include: tobacco use, male sex  ED: patient reports "intimacy difficulties." Endorses decreased strength of erections, inability to maintain an erection to competion of intercourse, and decreased satisfaction with sex. Denies low libido, mood change, decreased endurance/strength, fatigue or other symptoms of hypogonadism. Denies urinary symptoms, including obstructive symptoms.  Health Maintenance Health Maintenance  Topic Date Due  . HIV Screening  07/29/1984  . TETANUS/TDAP  07/29/1988  . INFLUENZA VACCINE  12/24/2016    Past Medical History:  Diagnosis Date  . Anxiety   . Asthma   . Hearing loss associated with syndrome of both ears   . History of alcohol dependence ([place]) 06/07/2015   Has been prescribed Librium for withdrawal  . Hypertension   . Lumbosacral radiculopathy at L4 10/10/2016  . Neck pain   . Neuropathy, ulnar nerve 06/07/2015  . PTSD (post-traumatic stress disorder) 06/07/2015  . Renal disorder    renal calculi  . Thrombocytopenia ([place]) 06/10/2015  . Tobacco use 12/09/2016   Past Surgical History:  Procedure Laterality Date  . CERVICAL SPINE SURGERY     5, 6, and 7 fusion   Social History  Substance Use Topics  . Smoking status: Current Every Day Smoker    Packs/day: 1.00    Years: 32.00    Types: Cigarettes  . Smokeless tobacco: Never Used  . Alcohol use Yes     Comment: 10-12 drinks   family history includes Breast cancer in his mother;  Head & neck cancer in his father; Heart failure in his father; Hypertension in his father.  ROS: negative except as noted in the HPI  Medications: Current Outpatient Prescriptions  Medication Sig Dispense Refill  . albuterol (PROVENTIL HFA;VENTOLIN HFA) 108 (90 Base) MCG/ACT inhaler Inhale into the lungs every 6 (six) hours as needed for wheezing or shortness of breath.    Marland Kitchen amLODipine (NORVASC) 5 MG tablet Take 7.5 mg by mouth daily.    . naproxen sodium (ANAPROX) 220 MG tablet Take 220 mg by mouth 2 (two) times daily with a meal.    . sildenafil (VIAGRA) 50 MG tablet Take 1/2 -1 tablet 30 minutes prior to sexual activity 15 tablet 0   No current facility-administered medications for this visit.    Allergies  Allergen Reactions  . Penicillins Anaphylaxis       Objective:  BP (!) 163/104   Pulse (!) 109   Wt 184 lb (83.5 kg)   BMI 24.95 kg/m  Gen: well-groomed, cooperative, not ill-appearing, no distress HEENT: normal conjunctiva, trachea midline Pulm: Normal work of breathing, normal phonation, clear to auscultation bilaterally, breath sounds diminished on expiration CV: Normal rate, regular rhythm, s1 and s2 distinct, no murmurs, clicks or rubs, no carotid bruit Neuro: alert and  oriented x 3, EOM's intact, PERRLA, DTR's intact, normal tone, no tremor MSK: moving all extremities, normal gait and station, no peripheral edema Skin: warm and dry, no rashes or lesions on exposed skin Psych: normal affect, euthymic mood, normal speech and thought content   No results found for this or any previous visit (from the past 72 hour(s)). No results found. Depression screen PHQ 2/9 12/09/2016  Decreased Interest 0  Down, Depressed, Hopeless 0  PHQ - 2 Score 0     Assessment and Plan: 46 y.o. male with   1. Encounter to establish care - reviewed PMH - reviewed Health Maintenance - Tdap UTD - negative PHQ2  2. Tobacco use - patient is not open to smoking cessation at this  time  3. Uncontrolled hypertension - cont Amlodipine 10mg  daily - patient to monitor BP's at home and log - therapeutic lifestyle changes. DASH eating plan - follow-up in 2 weeks. If continued to be uncontrolled, will add Lisinopril-HCTZ - CBC - COMPLETE METABOLIC PANEL WITH GFR - Lipid Panel w/reflex Direct LDL  4. Screening for lipid disorders - Lipid Panel w/reflex Direct LDL  5. Erectile dysfunction, unspecified erectile dysfunction type - SHIM score 8, moderate ED - negative screening for hypogonadism, negative AUASS - patient is healthy enough for sexual activity - sildenafil (VIAGRA) 50 MG tablet; Take 1/2 -1 tablet 30 minutes prior to sexual activity  Dispense: 15 tablet; Refill: 0  Orders Placed This Encounter  Procedures  . CBC  . COMPLETE METABOLIC PANEL WITH GFR  . Lipid Panel w/reflex Direct LDL   Patient education and anticipatory guidance given Patient agrees with treatment plan Follow-up in 2 weeks for HTN or sooner as needed  Darlyne Russian PA-C

## 2016-12-12 LAB — CBC
HCT: 41.9 % (ref 38.5–50.0)
Hemoglobin: 14.3 g/dL (ref 13.2–17.1)
MCH: 35.6 pg — ABNORMAL HIGH (ref 27.0–33.0)
MCHC: 34.1 g/dL (ref 32.0–36.0)
MCV: 104.2 fL — ABNORMAL HIGH (ref 80.0–100.0)
MPV: 9 fL (ref 7.5–12.5)
Platelets: 190 10*3/uL (ref 140–400)
RBC: 4.02 MIL/uL — ABNORMAL LOW (ref 4.20–5.80)
RDW: 13.4 % (ref 11.0–15.0)
WBC: 7.5 10*3/uL (ref 3.8–10.8)

## 2016-12-12 LAB — COMPLETE METABOLIC PANEL WITH GFR
ALBUMIN: 4 g/dL (ref 3.6–5.1)
ALK PHOS: 55 U/L (ref 40–115)
ALT: 21 U/L (ref 9–46)
AST: 26 U/L (ref 10–40)
BUN: 11 mg/dL (ref 7–25)
CHLORIDE: 103 mmol/L (ref 98–110)
CO2: 22 mmol/L (ref 20–31)
Calcium: 9.1 mg/dL (ref 8.6–10.3)
Creat: 0.78 mg/dL (ref 0.60–1.35)
GFR, Est African American: >89 mL/min (ref >=60)
GFR, Est Non African American: >89 mL/min (ref >=60)
GLUCOSE: 93 mg/dL (ref 65–99)
POTASSIUM: 3.4 mmol/L — AB (ref 3.5–5.3)
SODIUM: 138 mmol/L (ref 135–146)
Total Bilirubin: 1.5 mg/dL — ABNORMAL HIGH (ref 0.2–1.2)
Total Protein: 6.6 g/dL (ref 6.1–8.1)

## 2016-12-12 LAB — LIPID PANEL W/REFLEX DIRECT LDL
Cholesterol: 160 mg/dL (ref <200)
HDL: 106 mg/dL (ref >40)
LDL-Cholesterol: 41 mg/dL
NON-HDL CHOLESTEROL (CALC): 54 mg/dL (ref <130)
Total CHOL/HDL Ratio: 1.5 Ratio (ref <5.0)
Triglycerides: 53 mg/dL (ref <150)

## 2016-12-15 NOTE — Addendum Note (Signed)
Addended by: Darlyne Russian on: 12/15/2016 08:43 AM   Modules accepted: Orders

## 2016-12-15 NOTE — Progress Notes (Signed)
Potassium is slightly low. Increase intake of potassium-rich foods such as milk, bananas, avocados, sweet potato, spinach. Recheck this in 2 weeks Other labs look great.  LDL cholesterol is great! Keep up the good work

## 2016-12-25 ENCOUNTER — Ambulatory Visit: Payer: Commercial Managed Care - PPO | Admitting: Physician Assistant

## 2017-01-08 ENCOUNTER — Ambulatory Visit: Payer: Commercial Managed Care - PPO | Admitting: Physician Assistant

## 2017-06-30 ENCOUNTER — Emergency Department (INDEPENDENT_AMBULATORY_CARE_PROVIDER_SITE_OTHER)
Admission: EM | Admit: 2017-06-30 | Discharge: 2017-06-30 | Disposition: A | Discharge status: Home / Self Care | Payer: BLUE CROSS/BLUE SHIELD | Source: Home / Self Care | Attending: Emergency Medicine | Admitting: Emergency Medicine

## 2017-06-30 ENCOUNTER — Encounter: Payer: Self-pay | Admitting: *Deleted

## 2017-06-30 ENCOUNTER — Other Ambulatory Visit: Payer: Self-pay

## 2017-06-30 DIAGNOSIS — N23 Unspecified renal colic: Secondary | ICD-10-CM | POA: Diagnosis not present

## 2017-06-30 DIAGNOSIS — R319 Hematuria, unspecified: Secondary | ICD-10-CM

## 2017-06-30 LAB — POCT URINALYSIS DIP (MANUAL ENTRY)
Bilirubin, UA: NEGATIVE
Glucose, UA: NEGATIVE mg/dL
Ketones, POC UA: NEGATIVE mg/dL
Leukocytes, UA: NEGATIVE
Nitrite, UA: NEGATIVE
Spec Grav, UA: 1.015 (ref 1.010–1.025)
Urobilinogen, UA: 0.2 E.U./dL
pH, UA: 8.5 — AB (ref 5.0–8.0)

## 2017-06-30 MED ORDER — KETOROLAC TROMETHAMINE 60 MG/2ML IM SOLN
60.0000 mg | Freq: Once | INTRAMUSCULAR | Status: AC
  Administered 2017-06-30: 60 mg via INTRAMUSCULAR

## 2017-06-30 MED ORDER — CIPROFLOXACIN HCL 500 MG PO TABS: 500.0000 mg | ORAL_TABLET | Freq: Two times a day (BID) | ORAL | 0 refills | Status: AC

## 2017-06-30 MED ORDER — TAMSULOSIN HCL 0.4 MG PO CAPS: 0.4000 mg | ORAL_CAPSULE | Freq: Two times a day (BID) | ORAL | 0 refills | Status: AC

## 2017-06-30 MED ORDER — HYDROCODONE-ACETAMINOPHEN 5-325 MG PO TABS: 1.0000 | ORAL_TABLET | ORAL | 0 refills | Status: AC | PRN

## 2017-06-30 NOTE — ED Provider Notes (Signed)
[name]    CSN: [id number] Arrival date & time: 06/30/17  1204     History   Chief Complaint Chief Complaint  Patient presents with  . Hematuria    HPI Gregory Hart is a 47 y.o. male.   The history is provided by the patient.  Hematuria  This is a recurrent problem. Episode onset: 4:30 AM today. Episode frequency: Intermittent. The problem has not changed since onset.Pertinent negatives include no chest pain, no headaches and no shortness of breath. Associated symptoms comments: Dysuria, urinary frequency, left flank pain and left low back pain.. Nothing relieves the symptoms. He has tried nothing for the symptoms.  47 year old male, here with wife.  He states he was in his usual state of health except occasionally over the past 1-2 months, occasional urinary hesitancy but no other urinary symptoms.  Then, 4:30 AM today acute onset of left low back and left flank pain occasionally radiating to left groin, associated with dysuria, urinary frequency and he states that it feels like the kidney stone he had 2 years ago.  No definite fever but he did not take his temperature.  He recalls no injury.  He does have episodic musculoskeletal pain, history of lumbar radiculopathy and cervical spine surgery in the past but he states he feels this is not from his spine.  Denies focal weakness or numbness or radiation of pain into his legs.  He takes Aleve every day for musculoskeletal pain but is not tried any new treatment for the above acute symptoms. He states he passed a kidney stone 2 years ago, but has never seen a urologist or had further workup for this. I questioned him as to whether or not he takes any chronic controlled pain meds and he denies taking chronic control pain meds. He denies nausea, vomiting, diarrhea, chest pain, shortness of breath, fever, chills.  Past Medical History:  Diagnosis Date  . Anxiety   . Asthma   . Hearing loss associated with syndrome of both  ears   . History of alcohol dependence ([place]) 06/07/2015   Has been prescribed Librium for withdrawal  . Hypertension   . Lumbosacral radiculopathy at L4 10/10/2016  . Neck pain   . Neuropathy, ulnar nerve 06/07/2015  . PTSD (post-traumatic stress disorder) 06/07/2015  . Renal disorder    renal calculi  . Thrombocytopenia ([place]) 06/10/2015  . Tobacco use 12/09/2016    Patient Active Problem List   Diagnosis Date Noted  . Tobacco use 12/09/2016  . Hypertension goal BP (blood pressure) < 130/80 12/09/2016  . Uncontrolled hypertension 12/09/2016  . Erectile dysfunction 12/09/2016  . Lumbosacral radiculopathy at L4 10/10/2016  . Thrombocytopenia ([place]) 06/10/2015  . History of alcohol dependence ([place]) 06/07/2015  . Anxiety 06/07/2015  . Neuropathy, ulnar nerve 06/07/2015  . PTSD (post-traumatic stress disorder) 06/07/2015    Past Surgical History:  Procedure Laterality Date  . CERVICAL SPINE SURGERY     5, 6, and 7 fusion       Home Medications    Prior to Admission medications   Medication Sig Start Date End Date Taking? Authorizing Provider  albuterol (PROVENTIL HFA;VENTOLIN HFA) 108 (90 Base) MCG/ACT inhaler Inhale into the lungs every 6 (six) hours as needed for wheezing or shortness of breath.   Yes [provider]  amLODipine (NORVASC) 5 MG tablet Take 7.5 mg by mouth daily.   Yes [provider]  naproxen sodium (ANAPROX) 220 MG tablet Take 220 mg by mouth 2 (two)  times daily with a meal.   Yes [provider]  ciprofloxacin (CIPRO) 500 MG tablet Take 1 tablet (500 mg total) by mouth 2 (two) times daily. For 7 days 06/30/17   Jacqulyn Cane, MD  HYDROcodone-acetaminophen (NORCO/VICODIN) 5-325 MG tablet Take 1-2 tablets by mouth every 4 (four) hours as needed for severe pain. Take with food. 06/30/17   Jacqulyn Cane, MD  sildenafil (VIAGRA) 50 MG tablet Take 1/2 -1 tablet 30 minutes prior to sexual activity 12/09/16   Trixie Dredge, PA-C    tamsulosin (FLOMAX) 0.4 MG CAPS capsule Take 1 capsule (0.4 mg total) by mouth 2 (two) times daily after a meal for 5 days. To help pass kidney stone 06/30/17 07/05/17  Jacqulyn Cane, MD    Family History Family History  Problem Relation Age of Onset  . Heart failure Father   . Hypertension Father   . Head & neck cancer Father   . Breast cancer Mother     Social History Social History   Tobacco Use  . Smoking status: Current Every Day Smoker    Packs/day: 1.50    Years: 32.00    Pack years: 48.00    Types: Cigarettes  . Smokeless tobacco: Never Used  Substance Use Topics  . Alcohol use: Yes    Comment: 10-12 drinks  . Drug use: No     Allergies   Penicillins   Review of Systems Review of Systems  Respiratory: Negative for shortness of breath.   Cardiovascular: Negative for chest pain.  Genitourinary: Positive for dysuria, flank pain, frequency, hematuria and urgency. Negative for discharge, genital sores, scrotal swelling and testicular pain.  Neurological: Negative for seizures, syncope and headaches.  Psychiatric/Behavioral: Negative for hallucinations.  All other systems reviewed and are negative.  See also history of present illness  Physical Exam Triage Vital Signs ED Triage Vitals  Enc Vitals Group     BP 06/30/17 1234 (!) 162/93     Pulse Rate 06/30/17 1234 79     Resp 06/30/17 1234 18     Temp 06/30/17 1234 98.1 F (36.7 C)     Temp Source 06/30/17 1234 Oral     SpO2 06/30/17 1234 99 %     Weight 06/30/17 1247 184 lb (83.5 kg)     Height 06/30/17 1247 6' (1.829 m)     Head Circumference --      Peak Flow --      Pain Score 06/30/17 1247 7     Pain Loc --      Pain Edu? --      Excl. in [place]? --    No data found.  Updated Vital Signs BP (!) 162/93 (BP Location: Right Arm)   Pulse 79   Temp 98.1 F (36.7 C) (Oral)   Resp 18   Ht 6' (1.829 m)   Wt 184 lb (83.5 kg)   SpO2 99%   BMI 24.95 kg/m    Physical Exam  Constitutional:  Alert  male, in moderate discomfort, pointing to his left flank.  Cooperative.  No acute cardiorespiratory distress.  HENT:  Head: Normocephalic and atraumatic.  Mouth/Throat: Oropharynx is clear and moist.  Eyes: No scleral icterus.  Neck:  Nontender  Cardiovascular: Normal rate and regular rhythm.  Pulmonary/Chest: Effort normal and breath sounds normal. No respiratory distress.  Abdominal:  Soft, mild left flank tenderness but no definite CVA tenderness.  No distention, guarding, masses, or rebound. Minimal suprapubic tenderness  Genitourinary:  Genitourinary Comments: He  declined any GU exam, although I advised this  Musculoskeletal: Normal range of motion.  Neurological: No cranial nerve deficit or sensory deficit. He exhibits normal muscle tone.  Skin: Skin is warm and dry. No rash noted. He is not diaphoretic.  Psychiatric: He has a normal mood and affect.     UC Treatments / Results  Labs (all labs ordered are listed, but only abnormal results are displayed) Labs Reviewed  POCT URINALYSIS DIP (MANUAL ENTRY) - Abnormal; Notable for the following components:      Result Value   Blood, UA moderate (*)    pH, UA 8.5 (*)    Protein Ur, POC trace (*)    All other components within normal limits  URINE CULTURE    EKG  EKG Interpretation None       Radiology No results found.  Procedures Procedures (including critical care time)  Medications Ordered in UC Medications  ketorolac (TORADOL) injection 60 mg (60 mg Intramuscular Given 06/30/17 1339)     Initial Impression / Assessment and Plan / UC Course  I have reviewed the triage vital signs and the nursing notes.  Pertinent labs & imaging results that were available during my care of the patient were reviewed by me and considered in my medical decision making (see chart for details).     Urinalysis shows moderate blood, no leukocytes or nitrates.  He likely has left renal colic as cause for hematuria.  Also possible  urinary tract infection associated this. It is possible, but less likely that this is musculoskeletal pain.  Final Clinical Impressions(s) / UC Diagnoses   Final diagnoses:  Renal colic on left side  Hematuria, unspecified type  Workup and treatment options discussed, as well as risks, benefits, alternatives.  He declined any imaging or any other workup.  We will send urine for culture. Patient voiced understanding and agreement with the following plans: Toradol 60 mg IM stat.  He was rechecked 20 minutes later and pain had decreased from 8 out of 10 to a 5 out of 10.  ED Discharge Orders        Ordered    ciprofloxacin (CIPRO) 500 MG tablet  2 times daily     06/30/17 1327    tamsulosin (FLOMAX) 0.4 MG CAPS capsule  2 times daily after meals     06/30/17 1327    HYDROcodone-acetaminophen (NORCO/VICODIN) 5-325 MG tablet  Every 4 hours PRN     06/30/17 1327    Precautions discussed at length.  Explained to him I can only prescribe #12 of the Vicodin, and we cannot refill this.  This is only for severe pain. An After Visit Summary was printed and given to the patient.  We are prescribing Flomax to help pass kidney stone. Small prescription for Vicodin. We cannot refill this prescription. We are also prescribing an antibiotic to cover bacterial causes of urinary tract infection. Urine culture sent. Push fluids.  Strain your urine, and save any kidney stone if you pass it. Today, you need to call to set up appointment with urologist within 2 days. If any severe or worsening symptoms, go to emergency room  Controlled Substance Prescriptions [place] Controlled Substance Registry consulted? Yes, I have consulted the [place] Controlled Substances Registry for this patient, and feel the risk/benefit ratio today is favorable for proceeding with this prescription for a controlled substance.   Jacqulyn Cane, MD 07/02/17 1544

## 2017-06-30 NOTE — ED Triage Notes (Signed)
Pt c/o hematuria and low back pain x today. Took Aleve at 0430 today. Hx of kidney stone 3 years ago. Reports urinary hesitancy x 2 mths.

## 2017-06-30 NOTE — Discharge Instructions (Signed)
We are prescribing Flomax to help pass kidney stone. Small prescription for Vicodin. We cannot refill this prescription. We are also prescribing an antibiotic to cover bacterial causes of urinary tract infection. Urine culture sent. Push fluids.  Today, you need to call to set up appointment with urologist within 2 days. If any severe or worsening symptoms, go to emergency room

## 2017-07-03 LAB — URINE CULTURE
MICRO NUMBER:: 90153846
SPECIMEN QUALITY:: ADEQUATE

## 2017-07-04 ENCOUNTER — Telehealth: Payer: Self-pay | Admitting: Emergency Medicine

## 2017-07-04 NOTE — Telephone Encounter (Signed)
Patient informed of urine culture results and will complete medicine as prescribed.  Advised patient that if his sxs do not improve to follow up with PCP who may refer him to a urologists if need be.  Patient voices understanding.

## 2017-07-25 ENCOUNTER — Encounter: Payer: Self-pay | Admitting: Emergency Medicine

## 2017-07-25 ENCOUNTER — Emergency Department (INDEPENDENT_AMBULATORY_CARE_PROVIDER_SITE_OTHER): Payer: BLUE CROSS/BLUE SHIELD

## 2017-07-25 ENCOUNTER — Other Ambulatory Visit: Payer: Self-pay

## 2017-07-25 ENCOUNTER — Emergency Department (INDEPENDENT_AMBULATORY_CARE_PROVIDER_SITE_OTHER)
Admission: EM | Admit: 2017-07-25 | Discharge: 2017-07-25 | Disposition: A | Discharge status: Home / Self Care | Payer: BLUE CROSS/BLUE SHIELD | Source: Home / Self Care

## 2017-07-25 DIAGNOSIS — S61411A Laceration without foreign body of right hand, initial encounter: Secondary | ICD-10-CM | POA: Diagnosis not present

## 2017-07-25 DIAGNOSIS — W1802XA Striking against glass with subsequent fall, initial encounter: Secondary | ICD-10-CM

## 2017-07-25 MED ORDER — DOXYCYCLINE HYCLATE 100 MG PO CAPS: 100.0000 mg | ORAL_CAPSULE | Freq: Two times a day (BID) | ORAL | 0 refills | Status: AC

## 2017-07-25 NOTE — ED Triage Notes (Signed)
Patient fell through plate glass today and lacerated right hand; unsure if glass in area; stable bleeding; no pain meds prior to arrival but admits to alcohol consumption.

## 2017-07-25 NOTE — ED Provider Notes (Signed)
[facility]    CSN: [id number] Arrival date & time: 07/25/17  1522     History   Chief Complaint Chief Complaint  Patient presents with  . Hand Injury    right    HPI Gregory Hart is a 46 y.o. male.   Patient reports that he fell against a plate glass table top today, lacerating his right hand.  He denies other injuries.  He states that he just had his Tdap at the [place] yesterday during an annual exam.   The history is provided by the patient.  Laceration  Location:  Hand Hand laceration location:  R palm Length:  2.5cm Depth:  Through underlying tissue Quality comment:  Flap laceration Bleeding: controlled   Time since incident:  1 hour Laceration mechanism:  Broken glass Pain details:    Quality:  Aching   Severity:  Mild   Timing:  Constant   Progression:  Improving Foreign body present:  No foreign bodies Relieved by:  Nothing Worsened by:  Movement Ineffective treatments:  None tried Tetanus status:  Up to date Associated symptoms: no numbness and no swelling     Past Medical History:  Diagnosis Date  . Anxiety   . Asthma   . Hearing loss associated with syndrome of both ears   . History of alcohol dependence ([place]) 06/07/2015   Has been prescribed Librium for withdrawal  . Hypertension   . Lumbosacral radiculopathy at L4 10/10/2016  . Neck pain   . Neuropathy, ulnar nerve 06/07/2015  . PTSD (post-traumatic stress disorder) 06/07/2015  . Renal disorder    renal calculi  . Thrombocytopenia ([place]) 06/10/2015  . Tobacco use 12/09/2016    Patient Active Problem List   Diagnosis Date Noted  . Tobacco use 12/09/2016  . Hypertension goal BP (blood pressure) < 130/80 12/09/2016  . Uncontrolled hypertension 12/09/2016  . Erectile dysfunction 12/09/2016  . Lumbosacral radiculopathy at L4 10/10/2016  . Thrombocytopenia ([place]) 06/10/2015  . History of alcohol dependence ([place]) 06/07/2015  . Anxiety 06/07/2015  . Neuropathy, ulnar nerve 06/07/2015  .  PTSD (post-traumatic stress disorder) 06/07/2015    Past Surgical History:  Procedure Laterality Date  . CERVICAL SPINE SURGERY     5, 6, and 7 fusion       Home Medications    Prior to Admission medications   Medication Sig Start Date End Date Taking? Authorizing Provider  albuterol (PROVENTIL HFA;VENTOLIN HFA) 108 (90 Base) MCG/ACT inhaler Inhale into the lungs every 6 (six) hours as needed for wheezing or shortness of breath.    [provider]  amLODipine (NORVASC) 5 MG tablet Take 7.5 mg by mouth daily.    [provider]  ciprofloxacin (CIPRO) 500 MG tablet Take 1 tablet (500 mg total) by mouth 2 (two) times daily. For 7 days 06/30/17   Jacqulyn Cane, MD  doxycycline (VIBRAMYCIN) 100 MG capsule Take 1 capsule (100 mg total) by mouth 2 (two) times daily. Take with food. 07/25/17   Kandra Nicolas, MD  HYDROcodone-acetaminophen (NORCO/VICODIN) 5-325 MG tablet Take 1-2 tablets by mouth every 4 (four) hours as needed for severe pain. Take with food. 06/30/17   Jacqulyn Cane, MD  naproxen sodium (ANAPROX) 220 MG tablet Take 220 mg by mouth 2 (two) times daily with a meal.    [provider]  sildenafil (VIAGRA) 50 MG tablet Take 1/2 -1 tablet 30 minutes prior to sexual activity 12/09/16   Trixie Dredge, PA-C    Family History Family  History  Problem Relation Age of Onset  . Heart failure Father   . Hypertension Father   . Head & neck cancer Father   . Breast cancer Mother     Social History Social History   Tobacco Use  . Smoking status: Current Every Day Smoker    Packs/day: 1.50    Years: 32.00    Pack years: 48.00    Types: Cigarettes  . Smokeless tobacco: Never Used  Substance Use Topics  . Alcohol use: Yes    Comment: 10-12 drinks  . Drug use: No     Allergies   Penicillins   Review of Systems Review of Systems  All other systems reviewed and are negative.    Physical Exam Triage Vital Signs ED Triage Vitals  Enc  Vitals Group     BP 07/25/17 1542 (!) 118/97     Pulse Rate 07/25/17 1542 80     Resp 07/25/17 1542 18     Temp 07/25/17 1542 98.7 F (37.1 C)     Temp Source 07/25/17 1542 Oral     SpO2 07/25/17 1542 95 %     Weight 07/25/17 1543 182 lb (82.6 kg)     Height 07/25/17 1543 6' (1.829 m)     Head Circumference --      Peak Flow --      Pain Score 07/25/17 1542 1     Pain Loc --      Pain Edu? --      Excl. in [place]? --    No data found.  Updated Vital Signs BP (!) 118/97 (BP Location: Left Arm)   Pulse 80   Temp 98.7 F (37.1 C) (Oral)   Resp 18   Ht 6' (1.829 m)   Wt 182 lb (82.6 kg)   SpO2 95%   BMI 24.68 kg/m   Visual Acuity Right Eye Distance:   Left Eye Distance:   Bilateral Distance:    Right Eye Near:   Left Eye Near:    Bilateral Near:     Physical Exam  Constitutional: He appears well-developed and well-nourished. No distress.  HENT:  Head: Atraumatic.  Eyes: Pupils are equal, round, and reactive to light.  Cardiovascular: Normal rate.  Pulmonary/Chest: Effort normal.  Musculoskeletal: Normal range of motion.       Right hand: He exhibits laceration. He exhibits normal range of motion, no tenderness, no bony tenderness, normal two-point discrimination, normal capillary refill, no deformity and no swelling. Normal sensation noted.       Hands: 2.5cm flap laceration palmar surface of right hand/wrist as noted on diagram.  No foreign bodies noted.  Wrist has full range of motion.  Distal neurovascular function is intact.   Neurological: He is alert.  Skin: Skin is warm and dry.  Nursing note and vitals reviewed.    UC Treatments / Results  Labs (all labs ordered are listed, but only abnormal results are displayed) Labs Reviewed - No data to display  EKG  EKG Interpretation None       Radiology Dg Hand Complete Right  Result Date: 07/25/2017 CLINICAL DATA:  Fall through glass, right hand palm laceration EXAM: RIGHT HAND - COMPLETE 3+ VIEW  COMPARISON:  None. FINDINGS: No fracture or dislocation is seen. The joint spaces are preserved. Visualized soft tissues are within normal limits. No radiopaque foreign body is seen. IMPRESSION: No fracture, dislocation, or radiopaque foreign body is seen. Electronically Signed   By: Henderson Newcomer.D.  On: 07/25/2017 16:18    Procedures Procedures Laceration Repair Discussed benefits and risks of procedure and verbal consent obtained. Using sterile technique and local anesthesia with 1% lidocaine without epinephrine, cleansed wound with Betadine followed by copious lavage with normal saline.  Wound carefully inspected for debris and foreign bodies; none found.  Wound closed with # 6, 4-0 interrupted nylon sutures.  Bacitracin and non-stick sterile dressing applied.  Wound precautions explained to patient.  Return for suture removal in 12 days.   Medications Ordered in UC Medications - No data to display   Initial Impression / Assessment and Plan / UC Course  I have reviewed the triage vital signs and the nursing notes.  Pertinent labs & imaging results that were available during my care of the patient were reviewed by me and considered in my medical decision making (see chart for details).    No evidence of foreign bodies in wound. Note that patient has had staph infections in the past; will begin empiric doxycycline 100mg  BID. Change dressing daily and apply Bacitracin ointment to wound.  Keep wound clean and dry.  Return for any signs of infection (or follow-up with family doctor):  Increasing redness, swelling, pain, heat, drainage, etc. Return in 12 days for suture removal    Final Clinical Impressions(s) / UC Diagnoses   Final diagnoses:  Laceration of right hand without foreign body, initial encounter    ED Discharge Orders        Ordered    doxycycline (VIBRAMYCIN) 100 MG capsule  2 times daily     07/25/17 1708           Kandra Nicolas, MD 07/26/17 1220

## 2017-07-25 NOTE — Discharge Instructions (Addendum)
Change dressing daily and apply Bacitracin ointment to wound.  Keep wound clean and dry.  Return for any signs of infection (or follow-up with family doctor):  Increasing redness, swelling, pain, heat, drainage, etc. Return in 12 days for suture removal.

## 2018-08-14 IMAGING — DX DG HAND COMPLETE 3+V*R*
3 series · 3 of 3 positions shown · non-contrast
Comparison: None.

CLINICAL DATA: Fall through glass, right hand palm laceration

EXAM:
RIGHT HAND - COMPLETE 3+ VIEW

[hand pa]
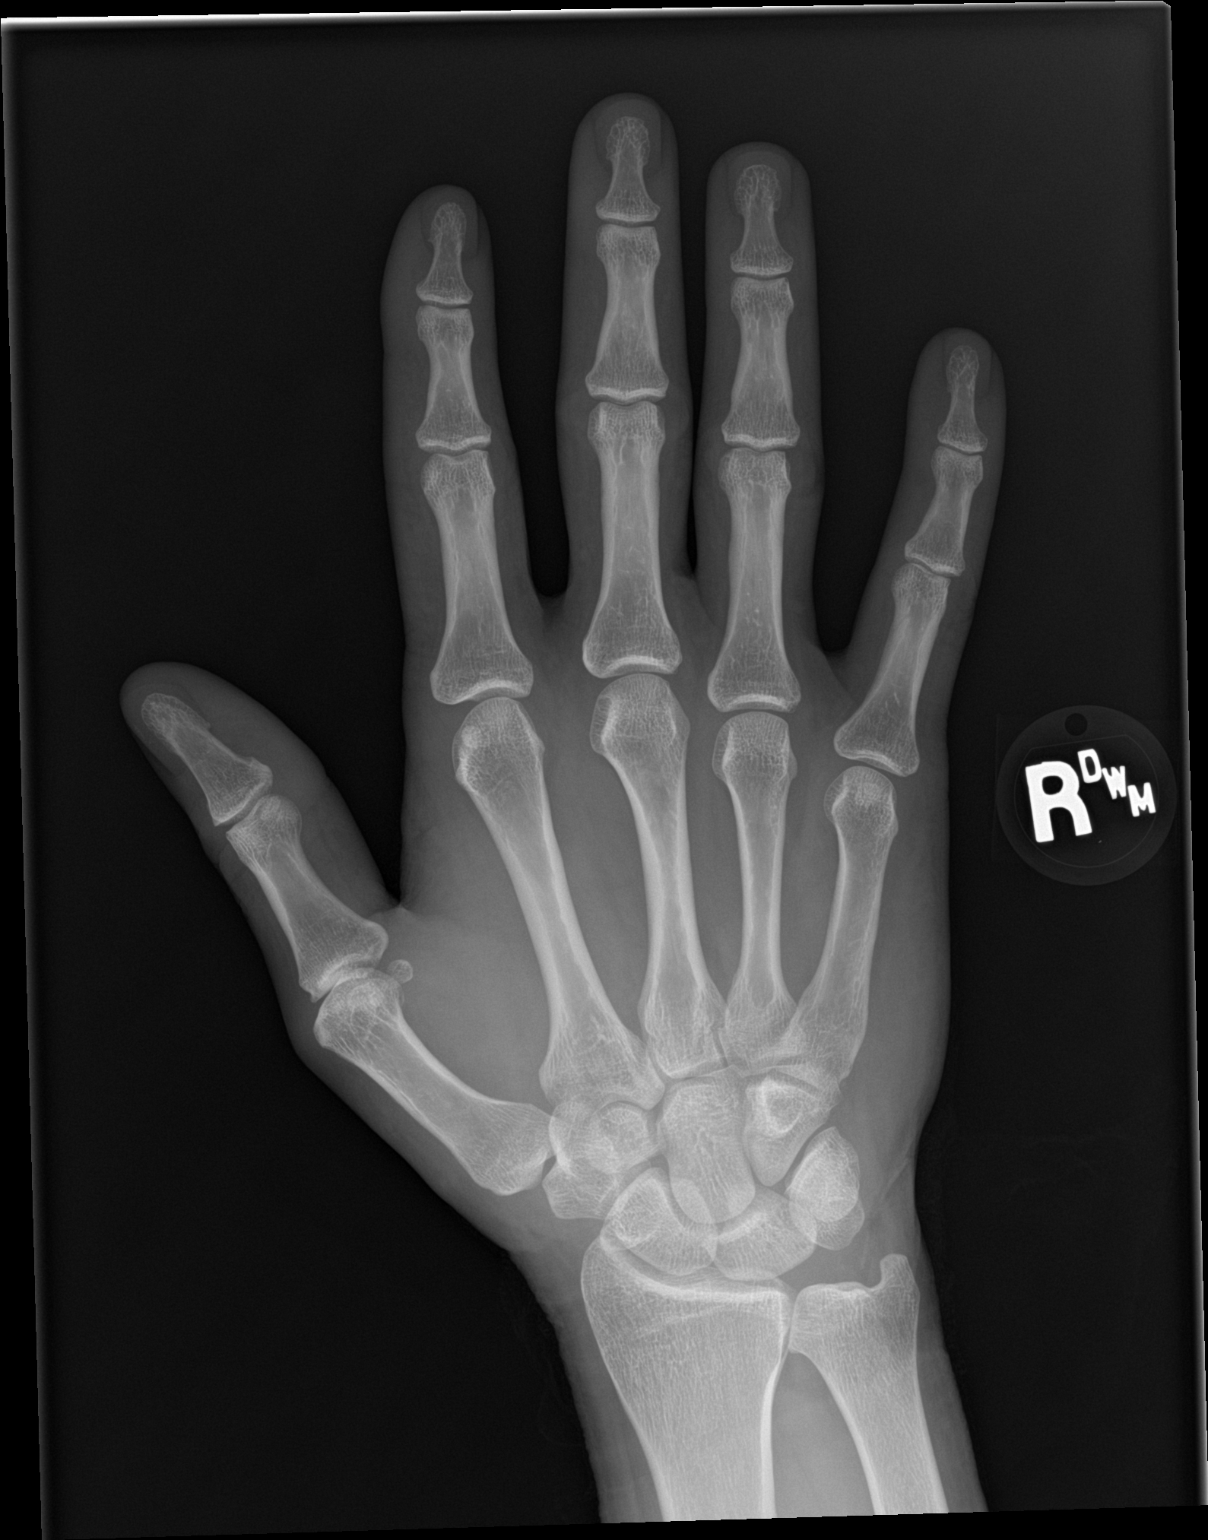

[hand obl]
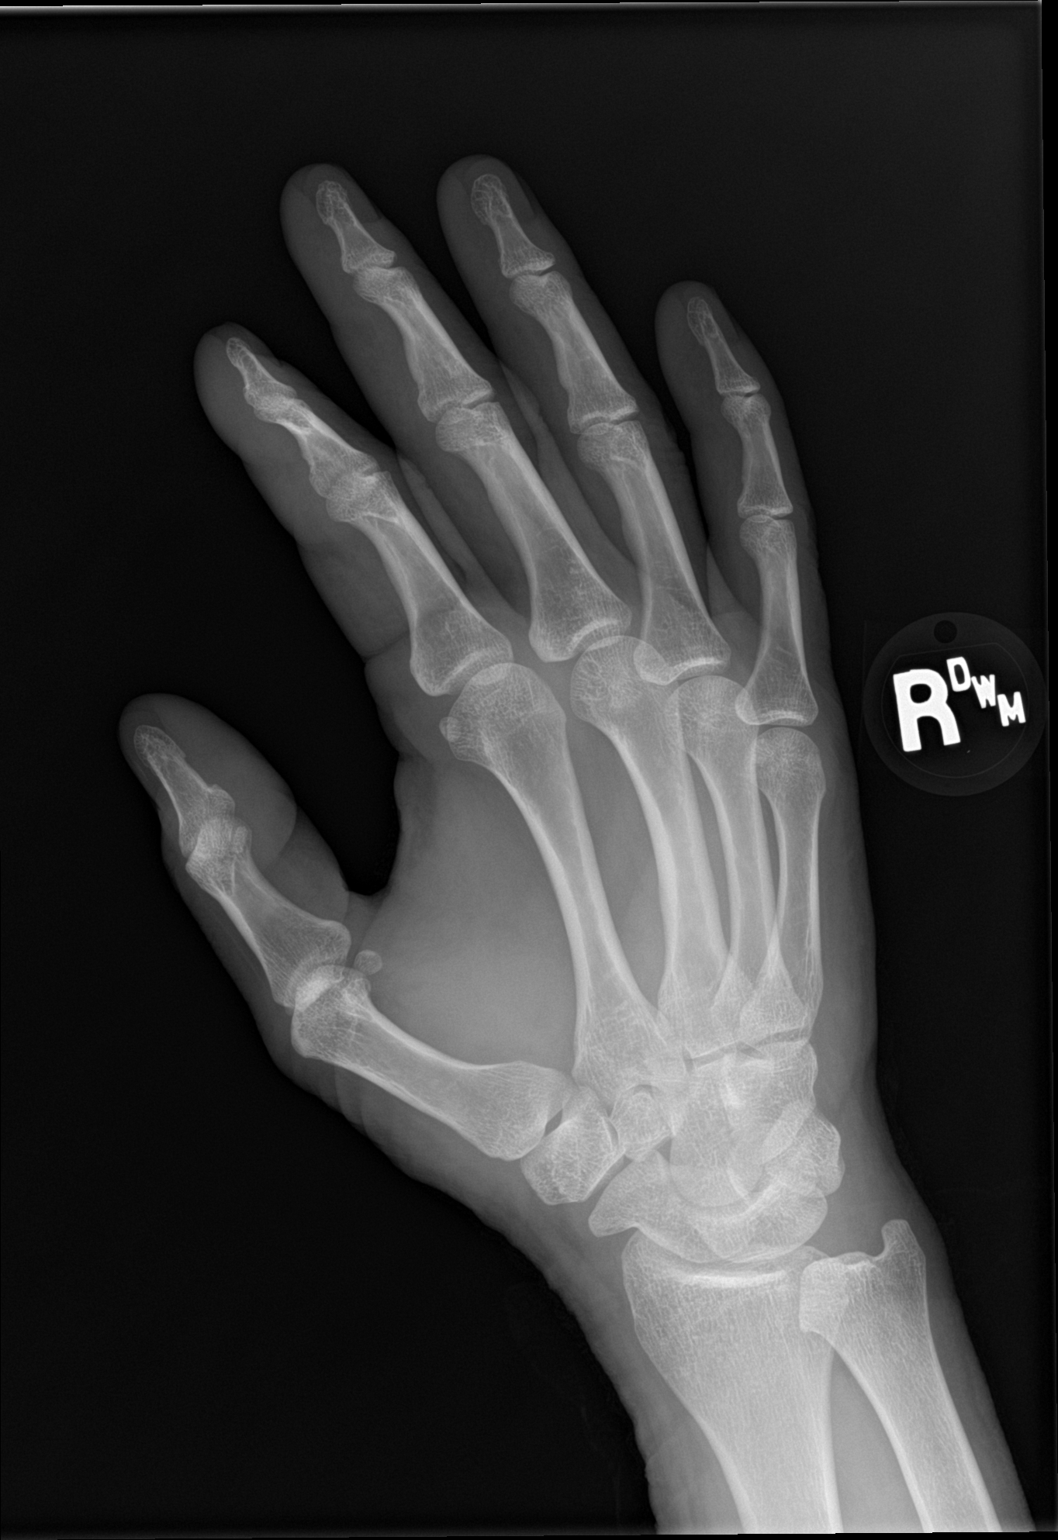

[hand lat]
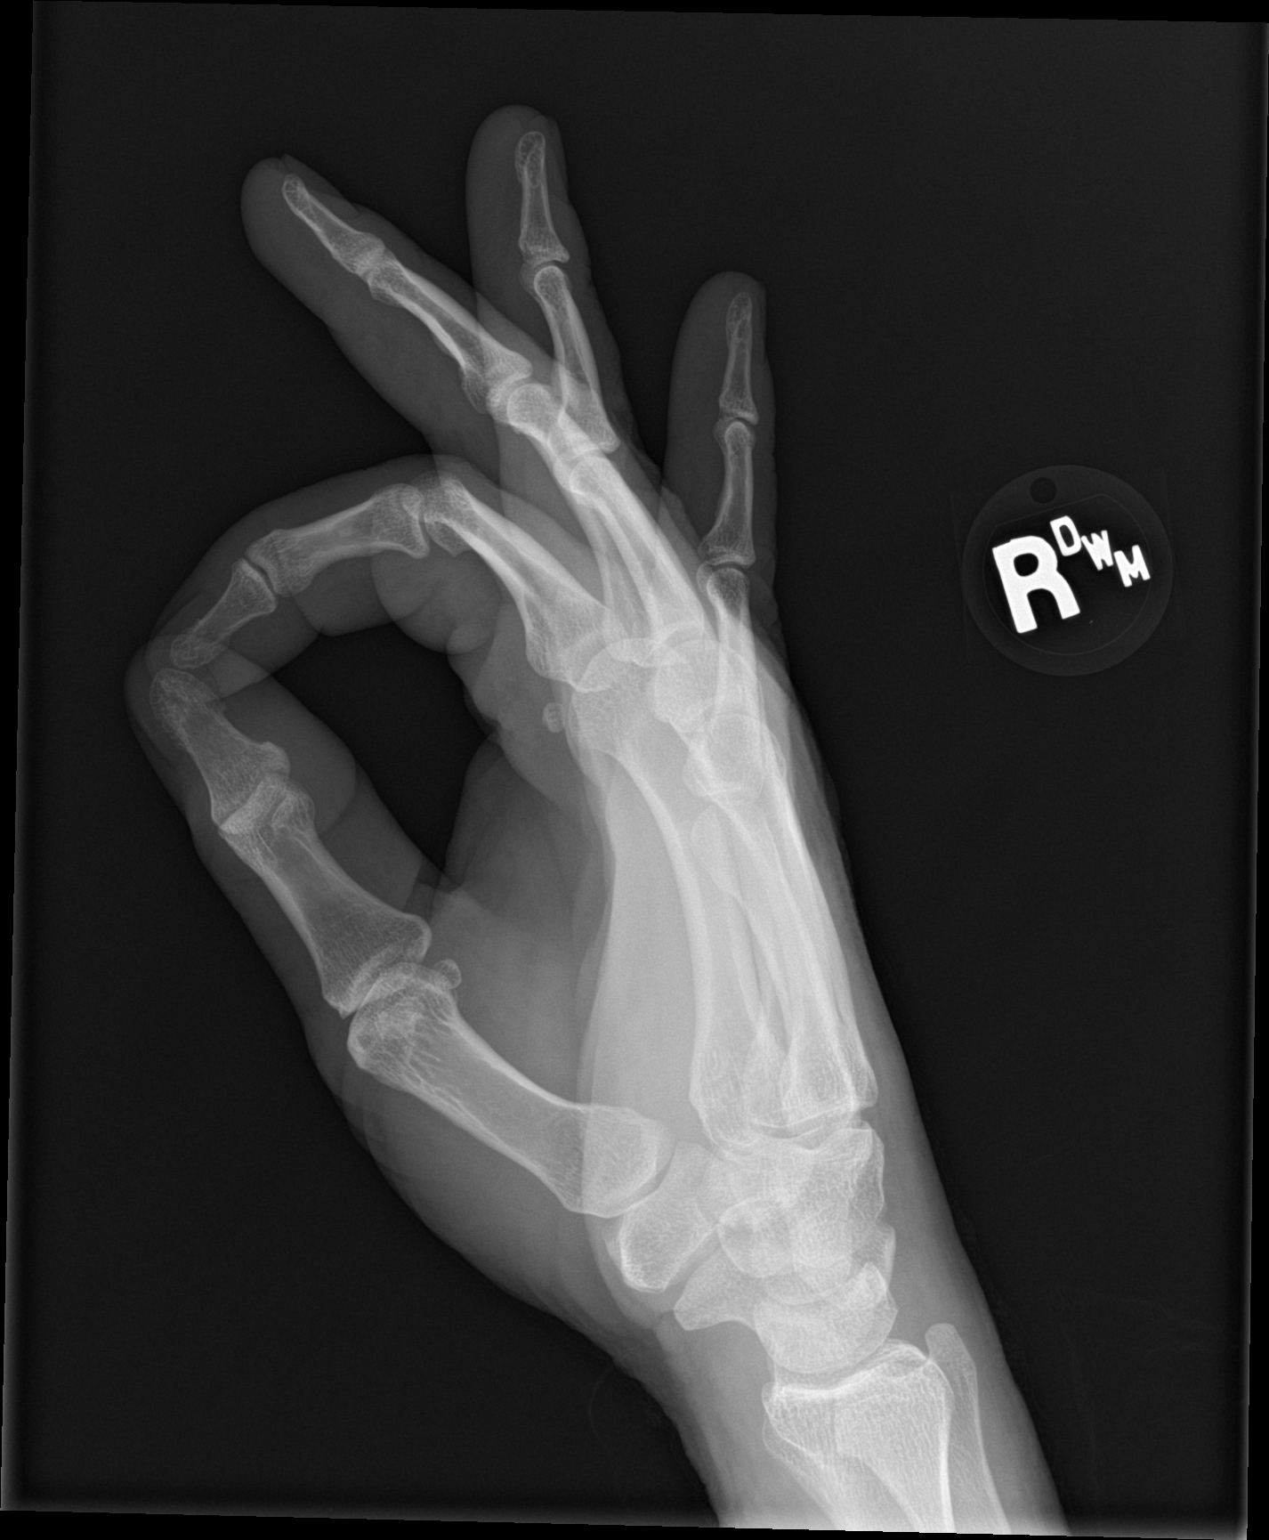

[3 of 3 positions shown; findings below may reference images not displayed]

FINDINGS: No fracture or dislocation is seen.

The joint spaces are preserved.

Visualized soft tissues are within normal limits.

No radiopaque foreign body is seen.
IMPRESSION: No fracture, dislocation, or radiopaque foreign body is seen.
